# Patient Record
Sex: Female | Born: 1960
Health system: Southern US, Community
[De-identification: ages and names within clinical notes are randomized; demographics above are authoritative.]

## PROBLEM LIST (undated history)

## (undated) DIAGNOSIS — K219 Gastro-esophageal reflux disease without esophagitis: Secondary | ICD-10-CM

## (undated) DIAGNOSIS — E785 Hyperlipidemia, unspecified: Secondary | ICD-10-CM

## (undated) DIAGNOSIS — M1990 Unspecified osteoarthritis, unspecified site: Secondary | ICD-10-CM

## (undated) DIAGNOSIS — I10 Essential (primary) hypertension: Secondary | ICD-10-CM

## (undated) DIAGNOSIS — T7840XA Allergy, unspecified, initial encounter: Secondary | ICD-10-CM

## (undated) DIAGNOSIS — F419 Anxiety disorder, unspecified: Secondary | ICD-10-CM

## (undated) DIAGNOSIS — E079 Disorder of thyroid, unspecified: Secondary | ICD-10-CM

## (undated) HISTORY — DX: Disorder of thyroid, unspecified: E07.9

## (undated) HISTORY — DX: Hyperlipidemia, unspecified: E78.5

## (undated) HISTORY — DX: Unspecified osteoarthritis, unspecified site: M19.90

## (undated) HISTORY — DX: Gastro-esophageal reflux disease without esophagitis: K21.9

## (undated) HISTORY — DX: Allergy, unspecified, initial encounter: T78.40XA

## (undated) HISTORY — DX: Anxiety disorder, unspecified: F41.9

## (undated) HISTORY — DX: Essential (primary) hypertension: I10

---
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
## 2006-02-09 HISTORY — PX: SHOULDER SURGERY: SHX246

## 2007-02-10 HISTORY — PX: KNEE SURGERY: SHX244

## 2010-02-09 HISTORY — PX: SHOULDER SURGERY: SHX246

## 2016-04-08 DIAGNOSIS — E039 Hypothyroidism, unspecified: Secondary | ICD-10-CM | POA: Insufficient documentation

## 2016-04-08 DIAGNOSIS — I10 Essential (primary) hypertension: Secondary | ICD-10-CM | POA: Insufficient documentation

## 2016-04-08 DIAGNOSIS — F419 Anxiety disorder, unspecified: Secondary | ICD-10-CM | POA: Insufficient documentation

## 2017-08-10 DIAGNOSIS — K2990 Gastroduodenitis, unspecified, without bleeding: Secondary | ICD-10-CM | POA: Insufficient documentation

## 2017-11-26 DIAGNOSIS — K259 Gastric ulcer, unspecified as acute or chronic, without hemorrhage or perforation: Secondary | ICD-10-CM | POA: Insufficient documentation

## 2017-11-26 DIAGNOSIS — K2210 Ulcer of esophagus without bleeding: Secondary | ICD-10-CM | POA: Insufficient documentation

## 2017-11-26 DIAGNOSIS — K2100 Gastro-esophageal reflux disease with esophagitis, without bleeding: Secondary | ICD-10-CM | POA: Insufficient documentation

## 2019-01-20 DIAGNOSIS — K635 Polyp of colon: Secondary | ICD-10-CM | POA: Insufficient documentation

## 2019-02-10 HISTORY — PX: OTHER SURGICAL HISTORY: SHX169

## 2019-07-24 DIAGNOSIS — R9431 Abnormal electrocardiogram [ECG] [EKG]: Secondary | ICD-10-CM | POA: Insufficient documentation

## 2019-10-11 DIAGNOSIS — R197 Diarrhea, unspecified: Secondary | ICD-10-CM | POA: Diagnosis not present

## 2019-10-11 DIAGNOSIS — Z86010 Personal history of colonic polyps: Secondary | ICD-10-CM | POA: Diagnosis not present

## 2019-10-11 DIAGNOSIS — K219 Gastro-esophageal reflux disease without esophagitis: Secondary | ICD-10-CM | POA: Diagnosis not present

## 2019-10-11 DIAGNOSIS — R109 Unspecified abdominal pain: Secondary | ICD-10-CM | POA: Diagnosis not present

## 2019-10-18 DIAGNOSIS — R197 Diarrhea, unspecified: Secondary | ICD-10-CM | POA: Diagnosis not present

## 2019-10-18 DIAGNOSIS — R748 Abnormal levels of other serum enzymes: Secondary | ICD-10-CM | POA: Diagnosis not present

## 2019-10-18 DIAGNOSIS — R300 Dysuria: Secondary | ICD-10-CM | POA: Diagnosis not present

## 2019-10-18 DIAGNOSIS — E785 Hyperlipidemia, unspecified: Secondary | ICD-10-CM | POA: Diagnosis not present

## 2019-10-19 DIAGNOSIS — K828 Other specified diseases of gallbladder: Secondary | ICD-10-CM | POA: Diagnosis not present

## 2019-10-19 DIAGNOSIS — K7689 Other specified diseases of liver: Secondary | ICD-10-CM | POA: Diagnosis not present

## 2019-10-19 DIAGNOSIS — R748 Abnormal levels of other serum enzymes: Secondary | ICD-10-CM | POA: Diagnosis not present

## 2019-11-02 DIAGNOSIS — R197 Diarrhea, unspecified: Secondary | ICD-10-CM | POA: Diagnosis not present

## 2019-11-02 DIAGNOSIS — R109 Unspecified abdominal pain: Secondary | ICD-10-CM | POA: Diagnosis not present

## 2019-11-02 DIAGNOSIS — K5730 Diverticulosis of large intestine without perforation or abscess without bleeding: Secondary | ICD-10-CM | POA: Diagnosis not present

## 2019-12-12 DIAGNOSIS — R350 Frequency of micturition: Secondary | ICD-10-CM | POA: Diagnosis not present

## 2019-12-12 DIAGNOSIS — N3001 Acute cystitis with hematuria: Secondary | ICD-10-CM | POA: Diagnosis not present

## 2019-12-12 DIAGNOSIS — R300 Dysuria: Secondary | ICD-10-CM | POA: Diagnosis not present

## 2020-02-13 DIAGNOSIS — R197 Diarrhea, unspecified: Secondary | ICD-10-CM | POA: Diagnosis not present

## 2020-02-13 DIAGNOSIS — K219 Gastro-esophageal reflux disease without esophagitis: Secondary | ICD-10-CM | POA: Diagnosis not present

## 2020-03-13 ENCOUNTER — Ambulatory Visit (INDEPENDENT_AMBULATORY_CARE_PROVIDER_SITE_OTHER): Payer: BC Managed Care – PPO | Admitting: Family Medicine

## 2020-03-13 ENCOUNTER — Ambulatory Visit (INDEPENDENT_AMBULATORY_CARE_PROVIDER_SITE_OTHER): Payer: BC Managed Care – PPO

## 2020-03-13 ENCOUNTER — Encounter: Payer: Self-pay | Admitting: Family Medicine

## 2020-03-13 ENCOUNTER — Other Ambulatory Visit: Payer: Self-pay

## 2020-03-13 VITALS — BP 117/78 | HR 64 | Temp 97.6°F | Ht 63.0 in | Wt 140.5 lb

## 2020-03-13 DIAGNOSIS — M19041 Primary osteoarthritis, right hand: Secondary | ICD-10-CM | POA: Diagnosis not present

## 2020-03-13 DIAGNOSIS — Z7689 Persons encountering health services in other specified circumstances: Secondary | ICD-10-CM

## 2020-03-13 DIAGNOSIS — F419 Anxiety disorder, unspecified: Secondary | ICD-10-CM

## 2020-03-13 DIAGNOSIS — M79644 Pain in right finger(s): Secondary | ICD-10-CM

## 2020-03-13 DIAGNOSIS — Z1231 Encounter for screening mammogram for malignant neoplasm of breast: Secondary | ICD-10-CM | POA: Diagnosis not present

## 2020-03-13 DIAGNOSIS — I10 Essential (primary) hypertension: Secondary | ICD-10-CM | POA: Diagnosis not present

## 2020-03-13 MED ORDER — OLMESARTAN MEDOXOMIL 40 MG PO TABS: 40.0000 mg | ORAL_TABLET | Freq: Every day | ORAL | 2 refills | Status: DC

## 2020-03-13 MED ORDER — DESVENLAFAXINE SUCCINATE ER 50 MG PO TB24: 50.0000 mg | ORAL_TABLET | Freq: Every day | ORAL | 2 refills | Status: DC

## 2020-03-13 NOTE — Progress Notes (Signed)
New Patient Office Visit  Subjective:  Patient ID: Cynthia Bolton, female    DOB: 07-Mar-1960  Age: 60 y.o. MRN: [id number]  CC:  Chief Complaint  Patient presents with  . New Patient (Initial Visit)    HPI Cynthia Bolton presents to establish care. She reports her HTN and hypothyroidism are both well controlled. She had labs about a month ago. She reports her anxiety is controlled well with pristiq.  She has one concern today.  1. Nodule of right pinky Cynthia Bolton has a nodule on her right pinky that looks like a blister of sorts. It is on the DIP.  It has been there for 6 months. Her previous PCP did lab work including a rheumatology panel which was normal. The plan to was to complete an xray next, but she moved prior to it being done. She does have a history of arthritis in her fingers. She has pain in the DIP of her pinky and the nodule will sometime becomes a little red. She denies injury, drainage, decreased ROM.  Past Medical History:  Diagnosis Date  . Allergy   . Anxiety   . Arthritis   . GERD (gastroesophageal reflux disease)   . Hyperlipidemia   . Hypertension   . Thyroid disease     Past Surgical History:  Procedure Laterality Date  . KNEE SURGERY Left 2009   left knee scope  . linx device  2021   linx device inserted for GERD  . SHOULDER SURGERY Right 2008   right shoulder scope  . SHOULDER SURGERY Left 2012   left shoulder scope  . TONSILECTOMY, ADENOIDECTOMY, BILATERAL MYRINGOTOMY AND TUBES  1968    Family History  Problem Relation Age of Onset  . Arthritis Mother   . Diabetes Mother   . Heart disease Mother   . Hyperlipidemia Mother   . Hypertension Mother   . Heart disease Father   . Hypertension Father   . Anxiety disorder Sister   . Arthritis Sister   . Hyperlipidemia Sister   . Hypertension Sister   . Hyperlipidemia Brother   . Hypertension Brother   . Heart disease Daughter 76       had pacemaker placed for unknown reason  . Stroke Maternal  Grandmother   . Heart disease Maternal Grandfather   . Heart disease Paternal Grandfather   . Heart attack Paternal Grandfather     Social History   Socioeconomic History  . Marital status: Divorced    Spouse name: Not on file  . Number of children: 2  . Years of education: 83  . Highest education level: High school graduate  Occupational History  . Occupation: Engineer, petroleum  Tobacco Use  . Smoking status: Former Smoker    Packs/day: 1.00    Years: 3.00    Pack years: 3.00    Types: Cigarettes    Quit date: 02/2015    Years since quitting: 5.0  . Smokeless tobacco: Never Used  Vaping Use  . Vaping Use: Never used  Substance and Sexual Activity  . Alcohol use: Yes    Alcohol/week: 36.0 standard drinks    Types: 21 Shots of liquor, 14 Glasses of wine, 1 Cans of beer per week  . Drug use: Yes    Types: Marijuana    Comment: to sleep at night  . Sexual activity: Not Currently    Birth control/protection: Post-menopausal  Other Topics Concern  . Not on file  Social History Narrative  . Not on  file   Social Determinants of Health   Financial Resource Strain: Not on file  Food Insecurity: Not on file  Transportation Needs: Not on file  Physical Activity: Not on file  Stress: Not on file  Social Connections: Not on file  Intimate Partner Violence: Not on file    ROS Review of Systems Negative unless specially indicated above in HPI.   Objective:   Today's Vitals: BP 117/78   Pulse 64   Temp 97.6 F (36.4 C) (Temporal)   Ht 5\' 3"  (1.6 m)   Wt 140 lb 8 oz (63.7 kg)   BMI 24.89 kg/m   Physical Exam Vitals and nursing note reviewed.  Constitutional:      General: She is not in acute distress.    Appearance: Normal appearance. She is not ill-appearing.  Cardiovascular:     Rate and Rhythm: Normal rate and regular rhythm.     Pulses: Normal pulses.     Heart sounds: Normal heart sounds. No murmur heard.   Pulmonary:     Effort: Pulmonary effort is  normal. No respiratory distress.     Breath sounds: Normal breath sounds.  Abdominal:     General: Bowel sounds are normal. There is no distension.     Palpations: Abdomen is soft. There is no mass.     Tenderness: There is no abdominal tenderness. There is no guarding or rebound.  Musculoskeletal:     Cervical back: Neck supple. No tenderness.     Right lower leg: No edema.     Left lower leg: No edema.     Comments: Heberden's nodules on bilateral 2-5th DIP. Full ROM, no warmth or TTP. Sensation intact.   Lymphadenopathy:     Cervical: No cervical adenopathy.  Skin:    General: Skin is warm and dry.  Neurological:     General: No focal deficit present.     Mental Status: She is alert and oriented to person, place, and time.  Psychiatric:        Mood and Affect: Mood normal.        Behavior: Behavior normal.     Assessment & Plan:   Cynthia Bolton was seen today for new patient (initial visit).  Diagnoses and all orders for this visit:  Encounter for screening mammogram for malignant neoplasm of breast Screening mammogram ordered.  -     MM Digital Screening; Future  Pain of finger of right hand Xray today in office. Radiology reports pending.  -     DG Finger Little Right  Anxiety Well controlled on current regimen.  -     olmesartan (BENICAR) 40 MG tablet; Take 1 tablet (40 mg total) by mouth daily.  Primary hypertension Well controlled on current regimen.  -     desvenlafaxine (PRISTIQ) 50 MG 24 hr tablet; Take 1 tablet (50 mg total) by mouth daily.  Encounter to establish care Reviewed records available in Epic.   Follow-up: Return in about 2 months (around 05/11/2020) for CPE with PAP.   The patient indicates understanding of these issues and agrees with the plan.    07/11/2020, FNP

## 2020-03-13 NOTE — Patient Instructions (Signed)

## 2020-05-07 ENCOUNTER — Ambulatory Visit
Admission: RE | Admit: 2020-05-07 | Discharge: 2020-05-07 | Disposition: A | Discharge status: Home / Self Care | Payer: BC Managed Care – PPO | Source: Ambulatory Visit | Attending: Family Medicine | Admitting: Family Medicine

## 2020-05-07 ENCOUNTER — Other Ambulatory Visit: Payer: Self-pay

## 2020-05-07 DIAGNOSIS — Z1231 Encounter for screening mammogram for malignant neoplasm of breast: Secondary | ICD-10-CM

## 2020-05-10 ENCOUNTER — Ambulatory Visit (INDEPENDENT_AMBULATORY_CARE_PROVIDER_SITE_OTHER): Payer: BC Managed Care – PPO | Admitting: Family Medicine

## 2020-05-10 ENCOUNTER — Other Ambulatory Visit (HOSPITAL_COMMUNITY)
Admission: RE | Admit: 2020-05-10 | Discharge: 2020-05-10 | Disposition: A | Discharge status: Home / Self Care | Payer: BC Managed Care – PPO | Source: Ambulatory Visit | Attending: Family Medicine | Admitting: Family Medicine

## 2020-05-10 ENCOUNTER — Encounter: Payer: Self-pay | Admitting: Family Medicine

## 2020-05-10 ENCOUNTER — Other Ambulatory Visit: Payer: Self-pay

## 2020-05-10 VITALS — BP 105/71 | HR 69 | Temp 96.8°F | Ht 63.0 in | Wt 138.6 lb

## 2020-05-10 DIAGNOSIS — N393 Stress incontinence (female) (male): Secondary | ICD-10-CM

## 2020-05-10 DIAGNOSIS — Z124 Encounter for screening for malignant neoplasm of cervix: Secondary | ICD-10-CM | POA: Insufficient documentation

## 2020-05-10 DIAGNOSIS — Z6826 Body mass index (BMI) 26.0-26.9, adult: Secondary | ICD-10-CM

## 2020-05-10 DIAGNOSIS — E039 Hypothyroidism, unspecified: Secondary | ICD-10-CM

## 2020-05-10 DIAGNOSIS — Z01419 Encounter for gynecological examination (general) (routine) without abnormal findings: Secondary | ICD-10-CM | POA: Diagnosis not present

## 2020-05-10 DIAGNOSIS — T162XXA Foreign body in left ear, initial encounter: Secondary | ICD-10-CM

## 2020-05-10 DIAGNOSIS — I10 Essential (primary) hypertension: Secondary | ICD-10-CM

## 2020-05-10 DIAGNOSIS — Z01411 Encounter for gynecological examination (general) (routine) with abnormal findings: Secondary | ICD-10-CM | POA: Diagnosis not present

## 2020-05-10 NOTE — Patient Instructions (Addendum)
Urinary Incontinence  Urinary incontinence refers to a condition in which a person is unable to control where and when to pass urine. A person with this condition will urinate when he or she does not mean to (involuntarily). What are the causes? This condition may be caused by:  Medicines.  Infections.  Constipation.  Overactive bladder muscles.  Weak bladder muscles.  Weak pelvic floor muscles. These muscles provide support for the bladder, intestine, and, in women, the uterus.  Enlarged prostate in men. The prostate is a gland near the bladder. When it gets too big, it can pinch the urethra. With the urethra blocked, the bladder can weaken and lose the ability to empty properly.  Surgery.  Emotional factors, such as anxiety, stress, or post-traumatic stress disorder (PTSD).  Pelvic organ prolapse. This happens in women when organs shift out of place and into the vagina. This shift can prevent the bladder and urethra from working properly. What increases the risk? The following factors may make you more likely to develop this condition:  Older age.  Obesity and physical inactivity.  Pregnancy and childbirth.  Menopause.  Diseases that affect the nerves or spinal cord (neurological diseases).  Long-term (chronic) coughing. This can increase pressure on the bladder and pelvic floor muscles. What are the signs or symptoms? Symptoms may vary depending on the type of urinary incontinence you have. They include:  A sudden urge to urinate, but passing urine involuntarily before you can get to a bathroom (urge incontinence).  Suddenly passing urine with any activity that forces urine to pass, such as coughing, laughing, exercise, or sneezing (stress incontinence).  Needing to urinate often, but urinating only a small amount, or constantly dribbling urine (overflow incontinence).  Urinating because you cannot get to the bathroom in time due to a physical disability, such as  arthritis or injury, or communication and thinking problems, such as Alzheimer disease (functional incontinence). How is this diagnosed? This condition may be diagnosed based on:  Your medical history.  A physical exam.  Tests, such as: ? Urine tests. ? X-rays of your kidney and bladder. ? Ultrasound. ? CT scan. ? Cystoscopy. In this procedure, a health care provider inserts a tube with a light and camera (cystoscope) through the urethra and into the bladder in order to check for problems. ? Urodynamic testing. These tests assess how well the bladder, urethra, and sphincter can store and release urine. There are different types of urodynamic tests, and they vary depending on what the test is measuring. To help diagnose your condition, your health care provider may recommend that you keep a log of when you urinate and how much you urinate. How is this treated? Treatment for this condition depends on the type of incontinence that you have and its cause. Treatment may include:  Lifestyle changes, such as: ? Quitting smoking. ? Maintaining a healthy weight. ? Staying active. Try to get 150 minutes of moderate-intensity exercise every week. Ask your health care provider which activities are safe for you. ? Eating a healthy diet.  Avoid high-fat foods, like fried foods.  Avoid refined carbohydrates like white bread and white rice.  Limit how much alcohol and caffeine you drink.  Increase your fiber intake. Foods such as fresh fruits, vegetables, beans, and whole grains are healthy sources of fiber.  Pelvic floor muscle exercises.  Bladder training, such as lengthening the amount of time between bathroom breaks, or using the bathroom at regular intervals.  Using techniques to suppress bladder urges.   This can include distraction techniques or controlled breathing exercises.  Medicines to relax the bladder muscles and prevent bladder spasms.  Medicines to help slow or prevent the  growth of a man's prostate.  Botox injections. These can help relax the bladder muscles.  Using pulses of electricity to help change bladder reflexes (electrical nerve stimulation).  For women, using a medical device to prevent urine leaks. This is a small, tampon-like, disposable device that is inserted into the urethra.  Injecting collagen or carbon beads (bulking agents) into the urinary sphincter. These can help thicken tissue and close the bladder opening.  Surgery. Follow these instructions at home: Lifestyle  Limit alcohol and caffeine. These can fill your bladder quickly and irritate it.  Keep yourself clean to help prevent odors and skin damage. Ask your doctor about special skin creams and cleansers that can protect the skin from urine.  Consider wearing pads or adult diapers. Make sure to change them regularly, and always change them right after experiencing incontinence. General instructions  Take over-the-counter and prescription medicines only as told by your health care provider.  Use the bathroom about every 3-4 hours, even if you do not feel the need to urinate. Try to empty your bladder completely every time. After urinating, wait a minute. Then try to urinate again.  Make sure you are in a relaxed position while urinating.  If your incontinence is caused by nerve problems, keep a log of the medicines you take and the times you go to the bathroom.  Keep all follow-up visits as told by your health care provider. This is important. Contact a health care provider if:  You have pain that gets worse.  Your incontinence gets worse. Get help right away if:  You have a fever or chills.  You are unable to urinate.  You have redness in your groin area or down your legs. Summary  Urinary incontinence refers to a condition in which a person is unable to control where and when to pass urine.  This condition may be caused by medicines, infection, weak bladder  muscles, weak pelvic floor muscles, enlargement of the prostate (in men), or surgery.  The following factors increase your risk for developing this condition: older age, obesity, pregnancy and childbirth, menopause, neurological diseases, and chronic coughing.  There are several types of urinary incontinence. They include urge incontinence, stress incontinence, overflow incontinence, and functional incontinence.  This condition is usually treated first with lifestyle and behavioral changes, such as quitting smoking, eating a healthier diet, and doing regular pelvic floor exercises. Other treatment options include medicines, bulking agents, medical devices, electrical nerve stimulation, or surgery. This information is not intended to replace advice given to you by your health care provider. Make sure you discuss any questions you have with your health care provider. Document Revised: 02/05/2017 Document Reviewed: 05/07/2016 Elsevier Patient Education  2021 [employer]   Health Maintenance, Female Adopting a healthy lifestyle and getting preventive care are important in promoting health and wellness. Ask your health care provider about:  The right schedule for you to have regular tests and exams.  Things you can do on your own to prevent diseases and keep yourself healthy. What should I know about diet, weight, and exercise? Eat a healthy diet  Eat a diet that includes plenty of vegetables, fruits, low-fat dairy products, and lean protein.  Do not eat a lot of foods that are high in solid fats, added sugars, or sodium.   Maintain a healthy  weight Body mass index (BMI) is used to identify weight problems. It estimates body fat based on height and weight. Your health care provider can help determine your BMI and help you achieve or maintain a healthy weight. Get regular exercise Get regular exercise. This is one of the most important things you can do for your health. Most adults  should:  Exercise for at least 150 minutes each week. The exercise should increase your heart rate and make you sweat (moderate-intensity exercise).  Do strengthening exercises at least twice a week. This is in addition to the moderate-intensity exercise.  Spend less time sitting. Even light physical activity can be beneficial. Watch cholesterol and blood lipids Have your blood tested for lipids and cholesterol at 60 years of age, then have this test every 5 years. Have your cholesterol levels checked more often if:  Your lipid or cholesterol levels are high.  You are older than 60 years of age.  You are at high risk for heart disease. What should I know about cancer screening? Depending on your health history and family history, you may need to have cancer screening at various ages. This may include screening for:  Breast cancer.  Cervical cancer.  Colorectal cancer.  Skin cancer.  Lung cancer. What should I know about heart disease, diabetes, and high blood pressure? Blood pressure and heart disease  High blood pressure causes heart disease and increases the risk of stroke. This is more likely to develop in people who have high blood pressure readings, are of African descent, or are overweight.  Have your blood pressure checked: ? Every 3-5 years if you are 55-84 years of age. ? Every year if you are 34 years old or older. Diabetes Have regular diabetes screenings. This checks your fasting blood sugar level. Have the screening done:  Once every three years after age 37 if you are at a normal weight and have a low risk for diabetes.  More often and at a younger age if you are overweight or have a high risk for diabetes. What should I know about preventing infection? Hepatitis B If you have a higher risk for hepatitis B, you should be screened for this virus. Talk with your health care provider to find out if you are at risk for hepatitis B infection. Hepatitis C Testing  is recommended for:  Everyone born from [birth year] through 1965.  Anyone with known risk factors for hepatitis C. Sexually transmitted infections (STIs)  Get screened for STIs, including gonorrhea and chlamydia, if: ? You are sexually active and are younger than 60 years of age. ? You are older than 60 years of age and your health care provider tells you that you are at risk for this type of infection. ? Your sexual activity has changed since you were last screened, and you are at increased risk for chlamydia or gonorrhea. Ask your health care provider if you are at risk.  Ask your health care provider about whether you are at high risk for HIV. Your health care provider may recommend a prescription medicine to help prevent HIV infection. If you choose to take medicine to prevent HIV, you should first get tested for HIV. You should then be tested every 3 months for as long as you are taking the medicine. Pregnancy  If you are about to stop having your period (premenopausal) and you may become pregnant, seek counseling before you get pregnant.  Take 400 to 800 micrograms (mcg) of folic acid every day  if you become pregnant.  Ask for birth control (contraception) if you want to prevent pregnancy. Osteoporosis and menopause Osteoporosis is a disease in which the bones lose minerals and strength with aging. This can result in bone fractures. If you are 60 years old or older, or if you are at risk for osteoporosis and fractures, ask your health care provider if you should:  Be screened for bone loss.  Take a calcium or vitamin D supplement to lower your risk of fractures.  Be given hormone replacement therapy (HRT) to treat symptoms of menopause. Follow these instructions at home: Lifestyle  Do not use any products that contain nicotine or tobacco, such as cigarettes, e-cigarettes, and chewing tobacco. If you need help quitting, ask your health care provider.  Do not use street drugs.  Do not  share needles.  Ask your health care provider for help if you need support or information about quitting drugs. Alcohol use  Do not drink alcohol if: ? Your health care provider tells you not to drink. ? You are pregnant, may be pregnant, or are planning to become pregnant.  If you drink alcohol: ? Limit how much you use to 0-1 drink a day. ? Limit intake if you are breastfeeding.  Be aware of how much alcohol is in your drink. In the [place] one drink equals one 12 oz bottle of beer (355 mL), one 5 oz glass of wine (148 mL), or one 1 oz glass of hard liquor (44 mL). General instructions  Schedule regular health, dental, and eye exams.  Stay current with your vaccines.  Tell your health care provider if: ? You often feel depressed. ? You have ever been abused or do not feel safe at home. Summary  Adopting a healthy lifestyle and getting preventive care are important in promoting health and wellness.  Follow your health care provider's instructions about healthy diet, exercising, and getting tested or screened for diseases.  Follow your health care provider's instructions on monitoring your cholesterol and blood pressure. This information is not intended to replace advice given to you by your health care provider. Make sure you discuss any questions you have with your health care provider. Document Revised: 01/19/2018 Document Reviewed: 01/19/2018 Elsevier Patient Education  2021 [employer] Inc.     Why follow it? Research shows. . Those who follow the Mediterranean diet have a reduced risk of heart disease  . The diet is associated with a reduced incidence of Parkinson's and Alzheimer's diseases . People following the diet may have longer life expectancies and lower rates of chronic diseases  . The Dietary Guidelines for Americans recommends the Mediterranean diet as an eating plan to promote health and prevent disease  What Is the Mediterranean Diet?  . Healthy eating plan  based on typical foods and recipes of Mediterranean-style cooking . The diet is primarily a plant based diet; these foods should make up a majority of meals   Starches - Plant based foods should make up a majority of meals - They are an important sources of vitamins, minerals, energy, antioxidants, and fiber - Choose whole grains, foods high in fiber and minimally processed items  - Typical grain sources include wheat, oats, barley, corn, brown rice, bulgar, farro, millet, polenta, couscous  - Various types of beans include chickpeas, lentils, fava beans, black beans, white beans   Fruits  Veggies - Large quantities of antioxidant rich fruits & veggies; 6 or more servings  - Vegetables can be eaten raw or  lightly drizzled with oil and cooked  - Vegetables common to the traditional Mediterranean Diet include: artichokes, arugula, beets, broccoli, brussel sprouts, cabbage, carrots, celery, collard greens, cucumbers, eggplant, kale, leeks, lemons, lettuce, mushrooms, okra, onions, peas, peppers, potatoes, pumpkin, radishes, rutabaga, shallots, spinach, sweet potatoes, turnips, zucchini - Fruits common to the Mediterranean Diet include: apples, apricots, avocados, cherries, clementines, dates, figs, grapefruits, grapes, melons, nectarines, oranges, peaches, pears, pomegranates, strawberries, tangerines  Fats - Replace butter and margarine with healthy oils, such as olive oil, canola oil, and tahini  - Limit nuts to no more than a handful a day  - Nuts include walnuts, almonds, pecans, pistachios, pine nuts  - Limit or avoid candied, honey roasted or heavily salted nuts - Olives are central to the [employer] - can be eaten whole or used in a variety of dishes   Meats Protein - Limiting red meat: no more than a few times a month - When eating red meat: choose lean cuts and keep the portion to the size of deck of cards - Eggs: approx. 0 to 4 times a week  - Fish and lean poultry: at least 2 a  week  - Healthy protein sources include, chicken, [place], lean beef, lamb - Increase intake of seafood such as tuna, salmon, trout, mackerel, shrimp, scallops - Avoid or limit high fat processed meats such as sausage and bacon  Dairy - Include moderate amounts of low fat dairy products  - Focus on healthy dairy such as fat free yogurt, skim milk, low or reduced fat cheese - Limit dairy products higher in fat such as whole or 2% milk, cheese, ice cream  Alcohol - Moderate amounts of red wine is ok  - No more than 5 oz daily for women (all ages) and men older than age 63  - No more than 10 oz of wine daily for men younger than 65  Other - Limit sweets and other desserts  - Use herbs and spices instead of salt to flavor foods  - Herbs and spices common to the traditional Mediterranean Diet include: basil, bay leaves, chives, cloves, cumin, fennel, garlic, lavender, marjoram, mint, oregano, parsley, pepper, rosemary, sage, savory, sumac, tarragon, thyme   It's not just a diet, it's a lifestyle:  . The Mediterranean diet includes lifestyle factors typical of those in the region  . Foods, drinks and meals are best eaten with others and savored . Daily physical activity is important for overall good health . This could be strenuous exercise like running and aerobics . This could also be more leisurely activities such as walking, housework, yard-work, or taking the stairs . Moderation is the key; a balanced and healthy diet accommodates most foods and drinks . Consider portion sizes and frequency of consumption of certain foods   Meal Ideas & Options:  . Breakfast:  o Whole wheat toast or whole wheat English muffins with peanut butter & hard boiled egg o Steel cut oats topped with apples & cinnamon and skim milk  o Fresh fruit: banana, strawberries, melon, berries, peaches  o Smoothies: strawberries, bananas, greek yogurt, peanut butter o Low fat greek yogurt with blueberries and granola  o Egg  white omelet with spinach and mushrooms o Breakfast couscous: whole wheat couscous, apricots, skim milk, cranberries  . Sandwiches:  o Hummus and grilled vegetables (peppers, zucchini, squash) on whole wheat bread   o Grilled chicken on whole wheat pita with lettuce, tomatoes, cucumbers or tzatziki  o Tuna salad on whole  wheat bread: tuna salad made with greek yogurt, olives, red peppers, capers, green onions o Garlic rosemary lamb pita: lamb sauted with garlic, rosemary, salt & pepper; add lettuce, cucumber, greek yogurt to pita - flavor with lemon juice and black pepper  . Seafood:  o Mediterranean grilled salmon, seasoned with garlic, basil, parsley, lemon juice and black pepper o Shrimp, lemon, and spinach whole-grain pasta salad made with low fat greek yogurt  o Seared scallops with lemon orzo  o Seared tuna steaks seasoned salt, pepper, coriander topped with tomato mixture of olives, tomatoes, olive oil, minced garlic, parsley, green onions and cappers  . Meats:  o Herbed greek chicken salad with kalamata olives, cucumber, feta  o Red bell peppers stuffed with spinach, bulgur, lean ground beef (or lentils) & topped with feta   o Kebabs: skewers of chicken, tomatoes, onions, zucchini, squash  o [place] burgers: made with red onions, mint, dill, lemon juice, feta cheese topped with roasted red peppers . Vegetarian o Cucumber salad: cucumbers, artichoke hearts, celery, red onion, feta cheese, tossed in olive oil & lemon juice  o Hummus and whole grain pita points with a greek salad (lettuce, tomato, feta, olives, cucumbers, red onion) o Lentil soup with celery, carrots made with vegetable broth, garlic, salt and pepper  o Tabouli salad: parsley, bulgur, mint, scallions, cucumbers, tomato, radishes, lemon juice, olive oil, salt and pepper.

## 2020-05-10 NOTE — Progress Notes (Signed)
Cynthia Bolton is a 60 y.o. female presents to office today for annual physical exam examination.    Concerns today include: 1. Leaking urine [name] is concerned about leaking urine. She reports leaking with sneezing and coughing for some time however recently she has noticed intermittent wetness at times and a odor of urine. Denies fever, urgency, frequency, or dysuria.   Occupation: Librarian, academic for medical records, Marital status: single, Substance use:  Last colonoscopy: 2021 Last mammogram: 3 days ago, report pending Last pap smear: 3 years ago   Past Medical History:  Diagnosis Date  . Allergy   . Anxiety   . Arthritis   . GERD (gastroesophageal reflux disease)   . Hyperlipidemia   . Hypertension   . Thyroid disease    Social History   Socioeconomic History  . Marital status: Divorced    Spouse name: Not on file  . Number of children: 2  . Years of education: 44  . Highest education level: High school graduate  Occupational History  . Occupation: Sports administrator  Tobacco Use  . Smoking status: Former Smoker    Packs/day: 1.00    Years: 3.00    Pack years: 3.00    Types: Cigarettes    Quit date: 02/2015    Years since quitting: 5.2  . Smokeless tobacco: Never Used  Vaping Use  . Vaping Use: Never used  Substance and Sexual Activity  . Alcohol use: Yes    Alcohol/week: 36.0 standard drinks    Types: 21 Shots of liquor, 14 Glasses of wine, 1 Cans of beer per week  . Drug use: Yes    Types: Marijuana    Comment: to sleep at night  . Sexual activity: Not Currently    Birth control/protection: Post-menopausal  Other Topics Concern  . Not on file  Social History Narrative  . Not on file   Social Determinants of Health   Financial Resource Strain: Not on file  Food Insecurity: Not on file  Transportation Needs: Not on file  Physical Activity: Not on file  Stress: Not on file  Social Connections: Not on file  Intimate Partner Violence: Not on file   Past  Surgical History:  Procedure Laterality Date  . KNEE SURGERY Left 2009   left knee scope  . linx device  2021   linx device inserted for GERD  . SHOULDER SURGERY Right 2008   right shoulder scope  . SHOULDER SURGERY Left 2012   left shoulder scope  . TONSILECTOMY, ADENOIDECTOMY, BILATERAL MYRINGOTOMY AND TUBES  1968   Family History  Problem Relation Age of Onset  . Arthritis Mother   . Diabetes Mother   . Heart disease Mother   . Hyperlipidemia Mother   . Hypertension Mother   . Heart disease Father   . Hypertension Father   . Anxiety disorder Sister   . Arthritis Sister   . Hyperlipidemia Sister   . Hypertension Sister   . Hyperlipidemia Brother   . Hypertension Brother   . Heart disease Daughter 73       had pacemaker placed for unknown reason  . Stroke Maternal Grandmother   . Heart disease Maternal Grandfather   . Heart disease Paternal Grandfather   . Heart attack Paternal Grandfather     Current Outpatient Medications:  .  Cholecalciferol 25 MCG (1000 UT) tablet, Take 1,000 Units by mouth daily., Disp: , Rfl:  .  desvenlafaxine (PRISTIQ) 50 MG 24 hr tablet, Take 1 tablet (50  mg total) by mouth daily., Disp: 90 tablet, Rfl: 2 .  ibuprofen (ADVIL) 600 MG tablet, Take 600 mg by mouth as needed., Disp: , Rfl:  .  levothyroxine (SYNTHROID) 50 MCG tablet, Take 50 mcg by mouth daily., Disp: , Rfl:  .  olmesartan (BENICAR) 40 MG tablet, Take 1 tablet (40 mg total) by mouth daily., Disp: 90 tablet, Rfl: 2 .  valACYclovir (VALTREX) 500 MG tablet, Take 500 mg by mouth as needed., Disp: , Rfl:   Allergies  Allergen Reactions  . Sulfamethoxazole-Trimethoprim Diarrhea     ROS: Review of Systems Pertinent items noted in HPI and remainder of comprehensive ROS otherwise negative.    Physical exam BP 105/71   Pulse 69   Temp (!) 96.8 F (36 C) (Temporal)   Ht _0  (1.6 m)   Wt 148 lb 9.6 oz (67.4 kg)   SpO2 100%   BMI 26.32 kg/m  General appearance: alert,  cooperative and no distress Head: Normocephalic, without obvious abnormality, atraumatic Eyes: conjunctivae/corneas clear. PERRL, EOM's intact.  Ears: normal TM's bilaterally. Normal right external ear and canal. Left ear: normal external ear. Black string like object present in canal.  Nose: Nares normal. Septum midline. Mucosa normal. No drainage or sinus tenderness. Throat: lips, mucosa, and tongue normal; teeth and gums normal Neck: no adenopathy, no carotid bruit, no JVD, supple, symmetrical, trachea midline and thyroid not enlarged, symmetric, no tenderness/mass/nodules Lungs: clear to auscultation bilaterally Heart: regular rate and rhythm, S1, S2 normal, no murmur, click, rub or gallop Abdomen: soft, non-tender; bowel sounds normal; no masses,  no organomegaly Pelvic: cervix normal in appearance, external genitalia normal, no adnexal masses or tenderness, no cervical motion tenderness, uterus normal size, shape, and consistency and vagina normal without discharge Extremities: extremities normal, atraumatic, no cyanosis or edema Skin: Skin color, texture, turgor normal. No rashes or lesions Neurologic: Alert and oriented X 3, normal strength and tone. Normal symmetric reflexes. Normal coordination and gait    Assessment/ Plan: Cynthia Bolton here for annual physical exam.   Cynthia Bolton was seen today for annual exam.  Diagnoses and all orders for this visit:  Well female exam with routine gynecological exam Labs pending as below.  -     Cytology - PAP -     CBC with Differential/Platelet -     CMP14+EGFR -     Lipid panel -     TSH -     VITAMIN D 25 Hydroxy (Vit-D Deficiency, Fractures)  BMI 26.0-26.9,adult Diet and exercise. Labs pending.  -     CBC with Differential/Platelet -     CMP14+EGFR -     Lipid panel -     TSH  Screening for malignant neoplasm of cervix -     Cytology - PAP  Stress incontinence GYN referral placed.  -     Ambulatory referral to  Gynecology  Foreign body of left ear, initial encounter Irrigation of left ear today in office- black dog hair removed.   Primary hypertension Well controlled on current regimen. Labs pending.  -     CBC with Differential/Platelet -     CMP14+EGFR -     Lipid panel  Acquired hypothyroidism On synthroid. Labs pending. -     TSH   Counseled on healthy lifestyle choices, including diet (rich in fruits, vegetables and lean meats and low in salt and simple carbohydrates) and exercise (at least 30 minutes of moderate physical activity daily).  Patient to follow up in  1 year for annual exam or sooner if needed.  The above assessment and management plan was discussed with the patient. The patient verbalized understanding of and has agreed to the management plan. Patient is aware to call the clinic if symptoms persist or worsen. Patient is aware when to return to the clinic for a follow-up visit. Patient educated on when it is appropriate to go to the emergency department.   Marjorie Smolder, FNP-C [facility] [address] [place], [place] [address] [phone]

## 2020-05-11 LAB — CBC WITH DIFFERENTIAL/PLATELET
Basophils Absolute: 0 10*3/uL (ref 0.0–0.2)
Basos: 1 %
EOS (ABSOLUTE): 0.1 10*3/uL (ref 0.0–0.4)
Eos: 1 %
Hematocrit: 37.1 % (ref 34.0–46.6)
Hemoglobin: 12.9 g/dL (ref 11.1–15.9)
Immature Grans (Abs): 0 10*3/uL (ref 0.0–0.1)
Immature Granulocytes: 0 %
Lymphocytes Absolute: 2.5 10*3/uL (ref 0.7–3.1)
Lymphs: 34 %
MCH: 31.3 pg (ref 26.6–33.0)
MCHC: 34.8 g/dL (ref 31.5–35.7)
MCV: 90 fL (ref 79–97)
Monocytes Absolute: 0.5 10*3/uL (ref 0.1–0.9)
Monocytes: 7 %
Neutrophils Absolute: 4.3 10*3/uL (ref 1.4–7.0)
Neutrophils: 57 %
Platelets: 297 10*3/uL (ref 150–450)
RBC: 4.12 x10E6/uL (ref 3.77–5.28)
RDW: 12.3 % (ref 11.7–15.4)
WBC: 7.5 10*3/uL (ref 3.4–10.8)

## 2020-05-11 LAB — CMP14+EGFR
ALT: 17 IU/L (ref 0–32)
AST: 22 IU/L (ref 0–40)
Albumin/Globulin Ratio: 2.2 (ref 1.2–2.2)
Albumin: 4.9 g/dL (ref 3.8–4.9)
Alkaline Phosphatase: 90 IU/L (ref 44–121)
BUN/Creatinine Ratio: 19 (ref 12–28)
BUN: 19 mg/dL (ref 8–27)
Bilirubin Total: 0.7 mg/dL (ref 0.0–1.2)
CO2: 21 mmol/L (ref 20–29)
Calcium: 10.4 mg/dL — ABNORMAL HIGH (ref 8.7–10.3)
Chloride: 103 mmol/L (ref 96–106)
Creatinine, Ser: 1.01 mg/dL — ABNORMAL HIGH (ref 0.57–1.00)
Globulin, Total: 2.2 g/dL (ref 1.5–4.5)
Glucose: 107 mg/dL — ABNORMAL HIGH (ref 65–99)
Potassium: 5.7 mmol/L — ABNORMAL HIGH (ref 3.5–5.2)
Sodium: 142 mmol/L (ref 134–144)
Total Protein: 7.1 g/dL (ref 6.0–8.5)
eGFR: 64 mL/min/{1.73_m2} (ref >59)

## 2020-05-11 LAB — LIPID PANEL
Chol/HDL Ratio: 4.5 ratio — ABNORMAL HIGH (ref 0.0–4.4)
Cholesterol, Total: 253 mg/dL — ABNORMAL HIGH (ref 100–199)
HDL: 56 mg/dL (ref >39)
LDL Chol Calc (NIH): 174 mg/dL — ABNORMAL HIGH (ref 0–99)
Triglycerides: 126 mg/dL (ref 0–149)
VLDL Cholesterol Cal: 23 mg/dL (ref 5–40)

## 2020-05-11 LAB — TSH: TSH: 1.81 u[IU]/mL (ref 0.450–4.500)

## 2020-05-11 LAB — VITAMIN D 25 HYDROXY (VIT D DEFICIENCY, FRACTURES): Vit D, 25-Hydroxy: 46.5 ng/mL (ref 30.0–100.0)

## 2020-05-13 ENCOUNTER — Other Ambulatory Visit: Payer: Self-pay | Admitting: Family Medicine

## 2020-05-13 DIAGNOSIS — E875 Hyperkalemia: Secondary | ICD-10-CM

## 2020-05-13 LAB — CYTOLOGY - PAP
Chlamydia: NEGATIVE
Comment: NEGATIVE
Diagnosis: NEGATIVE

## 2020-05-23 ENCOUNTER — Other Ambulatory Visit: Payer: Self-pay

## 2020-05-23 ENCOUNTER — Other Ambulatory Visit: Payer: BC Managed Care – PPO

## 2020-05-23 DIAGNOSIS — E875 Hyperkalemia: Secondary | ICD-10-CM | POA: Diagnosis not present

## 2020-05-24 LAB — BMP8+EGFR
BUN/Creatinine Ratio: 13 (ref 12–28)
BUN: 14 mg/dL (ref 8–27)
CO2: 22 mmol/L (ref 20–29)
Calcium: 10.2 mg/dL (ref 8.7–10.3)
Chloride: 105 mmol/L (ref 96–106)
Creatinine, Ser: 1.07 mg/dL — ABNORMAL HIGH (ref 0.57–1.00)
Glucose: 100 mg/dL — ABNORMAL HIGH (ref 65–99)
Potassium: 5.1 mmol/L (ref 3.5–5.2)
Sodium: 142 mmol/L (ref 134–144)
eGFR: 59 mL/min/{1.73_m2} — ABNORMAL LOW (ref >59)

## 2020-07-04 ENCOUNTER — Other Ambulatory Visit (HOSPITAL_COMMUNITY)
Admission: RE | Admit: 2020-07-04 | Discharge: 2020-07-04 | Disposition: A | Discharge status: Home / Self Care | Payer: BC Managed Care – PPO | Source: Ambulatory Visit | Attending: Obstetrics and Gynecology | Admitting: Obstetrics and Gynecology

## 2020-07-04 ENCOUNTER — Other Ambulatory Visit: Payer: Self-pay

## 2020-07-04 ENCOUNTER — Ambulatory Visit (INDEPENDENT_AMBULATORY_CARE_PROVIDER_SITE_OTHER): Payer: BC Managed Care – PPO | Admitting: Family Medicine

## 2020-07-04 ENCOUNTER — Encounter: Payer: BC Managed Care – PPO | Admitting: Obstetrics and Gynecology

## 2020-07-04 ENCOUNTER — Encounter: Payer: Self-pay | Admitting: Family Medicine

## 2020-07-04 VITALS — BP 150/111 | HR 68 | Wt 142.0 lb

## 2020-07-04 DIAGNOSIS — N393 Stress incontinence (female) (male): Secondary | ICD-10-CM

## 2020-07-04 DIAGNOSIS — N3945 Continuous leakage: Secondary | ICD-10-CM | POA: Diagnosis not present

## 2020-07-04 DIAGNOSIS — N898 Other specified noninflammatory disorders of vagina: Secondary | ICD-10-CM | POA: Insufficient documentation

## 2020-07-04 DIAGNOSIS — R3121 Asymptomatic microscopic hematuria: Secondary | ICD-10-CM | POA: Diagnosis not present

## 2020-07-04 LAB — POCT URINALYSIS DIP (DEVICE)
Bilirubin Urine: NEGATIVE
Glucose, UA: NEGATIVE mg/dL
Ketones, ur: NEGATIVE mg/dL
Leukocytes,Ua: NEGATIVE
Nitrite: NEGATIVE
Protein, ur: NEGATIVE mg/dL
Specific Gravity, Urine: 1.02 (ref 1.005–1.030)
Urobilinogen, UA: 0.2 mg/dL (ref 0.0–1.0)
pH: 5.5 (ref 5.0–8.0)

## 2020-07-04 NOTE — Progress Notes (Signed)
   New GYNECOLOGY PROBLEM  VISIT ENCOUNTER NOTE  Subjective:   Cynthia Bolton is a 60 y.o. .[id number]. female here for a a problem GYN visit.  Current complaints: leaking urine for 5 year-- worse with coughing, sneezing, running, squatting. Reports it has become more continuous and feel she smells urine all the time.  Two vaginal deliveries, largest was sub 7#. Denies fecal incontinence. Denies back pain, paraesthesias in legs.   Reports no smoking history   Denies abnormal vaginal bleeding, discharge, pelvic pain, problems with intercourse or other gynecologic concerns.    Gynecologic History No LMP recorded. Patient is postmenopausal. Contraception: post menopausal status  Health Maintenance Due  Topic Date Due  . TETANUS/TDAP  Never done     The following portions of the patient's history were reviewed and updated as appropriate: allergies, current medications, past family history, past medical history, past social history, past surgical history and problem list.  Review of Systems Pertinent items are noted in HPI.   Objective:  BP (!) 150/111   Pulse 68   Wt 142 lb (64.4 kg)   BMI 25.15 kg/m  Gen: well appearing, NAD HEENT: no scleral icterus CV: RR Lung: Normal WOB Ext: warm well perfused  PELVIC: Urethral opening is slightly more prominent urethral with erythematous area visualized-- possible polyp? Normal appearing external genitalia; normal appearing vaginal mucosa and cervix.  No abnormal discharge noted.    Normal uterine size, no other palpable masses, no uterine or adnexal tenderness.   Assessment and Plan:   1. Vaginal odor - Cervicovaginal ancillary only( [place])  2. Stress incontinence Reviewed with patient the normality of exam overall and my concerns for urethral polyp? - Ambulatory referral to Physical Therapy - Ambulatory referral to Urogynecology  3. Continuous leakage of urine - Ambulatory referral to Physical Therapy - Ambulatory referral  to Urogynecology  4. Asymptomatic microscopic hematuria Referral to uro gyn for possible urethral polyp in the setting of urinary incontinence.   Please refer to After Visit Summary for other counseling recommendations.   Return in about 3 months (around 10/04/2020) for with Urogyn.   Future Appointments  Date Time Provider Department Center  11/11/2020  8:30 AM Gabriel Earing, FNP [facility] None     Federico Flake, MD, MPH, ABFM Attending Physician Faculty Practice- Center [facility]

## 2020-07-05 LAB — CERVICOVAGINAL ANCILLARY ONLY
Bacterial Vaginitis (gardnerella): NEGATIVE
Candida Glabrata: NEGATIVE
Candida Vaginitis: NEGATIVE
Chlamydia: NEGATIVE
Comment: NEGATIVE
Comment: NEGATIVE
Comment: NEGATIVE
Comment: NEGATIVE
Comment: NEGATIVE
Comment: NORMAL
Neisseria Gonorrhea: NEGATIVE
Trichomonas: NEGATIVE

## 2020-08-08 ENCOUNTER — Other Ambulatory Visit: Payer: Self-pay

## 2020-08-08 ENCOUNTER — Encounter: Payer: Self-pay | Admitting: Physical Therapy

## 2020-08-08 ENCOUNTER — Ambulatory Visit: Payer: BC Managed Care – PPO | Attending: Family Medicine | Admitting: Physical Therapy

## 2020-08-08 DIAGNOSIS — R2689 Other abnormalities of gait and mobility: Secondary | ICD-10-CM

## 2020-08-08 DIAGNOSIS — M6281 Muscle weakness (generalized): Secondary | ICD-10-CM | POA: Insufficient documentation

## 2020-08-08 NOTE — Therapy (Signed)
[facility] [address] [place], [place], [address] Phone: [phone]   Fax:  [phone]  Physical Therapy Evaluation  Patient Details  Name: Zacari Radick MRN: [id number] Date of Birth: 1960-11-09 Referring Provider (PT): Federico Flake, MD   Encounter Date: 08/08/2020   PT End of Session - 08/08/20 1208     Visit Number 1    Number of Visits 30    Authorization Type BCBS    Authorization - Visit Number 1    Authorization - Number of Visits 30    PT Start Time 1015    PT Stop Time 1059    PT Time Calculation (min) 44 min    Activity Tolerance Patient tolerated treatment well;No increased pain    Behavior During Therapy [facility] for tasks assessed/performed             Past Medical History:  Diagnosis Date   Allergy    Anxiety    Arthritis    GERD (gastroesophageal reflux disease)    Hyperlipidemia    Hypertension    Thyroid disease     Past Surgical History:  Procedure Laterality Date   KNEE SURGERY Left 2009   left knee scope   linx device  2021   linx device inserted for GERD   SHOULDER SURGERY Right 2008   right shoulder scope   SHOULDER SURGERY Left 2012   left shoulder scope   TONSILECTOMY, ADENOIDECTOMY, BILATERAL MYRINGOTOMY AND TUBES  1968    There were no vitals filed for this visit.    Subjective Assessment - 08/08/20 1014     Subjective Pt reports a one year history of urinary leakage with lifting weights and chronic history of leakage with coughing, laughing, sneezing and throughout the night light leakage is experienced.    Currently in Pain? No/denies                [facility] PT Assessment - 08/08/20 0001       Assessment   Medical Diagnosis N39.3 (ICD-10-CM) - Stress incontinence  N39.45 (ICD-10-CM) - Continuous leakage of urine    Referring Provider (PT) Federico Flake, MD    Onset Date/Surgical Date --   at least one year   Next MD Visit End of  August with Urologest    Prior Therapy previous PT but not for pelvic floor      Precautions   Precautions None      Restrictions   Weight Bearing Restrictions No      Balance Screen   Has the patient fallen in the past 6 months No      Home Environment   Living Environment Private residence    Living Arrangements Parent    Available Help at Discharge --   primary caregiger of father   Type of Home House    Additional Comments no additional leakage with stairs      Prior Function   Level of Independence Independent      Cognition   Overall Cognitive Status Within Functional Limits for tasks assessed      Observation/Other Assessments   Observations poor breathing mechanics without cues for correction with chest breathing noted and decreased rib mobility      Sensation   Light Touch Appears Intact      Coordination   Gross Motor Movements are Fluid and Coordinated Yes      Posture/Postural Control   Posture/Postural Control Postural limitations    Postural Limitations  Rounded Shoulders;Posterior pelvic tilt      ROM / Strength   AROM / PROM / Strength AROM;Strength      AROM   Overall AROM Comments Bil IR and ER limited 25%, hamstring 25% limited,    AROM Assessment Site Lumbar;Thoracic    Lumbar Flexion WFL    Lumbar Extension WFL    Lumbar - Right Side Bend limited by 25%    Lumbar - Left Side Bend limited by 25%    Lumbar - Right Rotation limited by 25%    Lumbar - Left Rotation limited by 25%    Thoracic - Right Side Bend limited by 25%    Thoracic - Left Side Bend limited by 25%      Strength   Overall Strength Comments Bil hip grossly 4/5      Palpation   Spinal mobility Lt Ilium anteriorly rotated    Palpation comment no noted tenderness at low back, spinal segments, gluteals, adductors. reported tenderness at Rt lower abdominal region with some fascial restrictions noted in this area and Lt side of bladder with decreased mobility as well. Decreased  activation noted with Bil transverse abdominus with core activation                        Objective measurements completed on examination: See above findings.     Pelvic Floor Special Questions - 08/08/20 0001     Are you Pregnant or attempting pregnancy? No    Prior Pregnancies Yes    Number of Pregnancies 2    Number of Vaginal Deliveries 2    Any difficulty with labor and deliveries Yes   increased labor time with both births   Episiotomy Performed Yes    Currently Sexually Active Yes    Is this Painful Yes   pain with initial penetration   History of sexually transmitted disease No    Urinary Leakage Yes    How often daily    Pad use no but reports she is going to buy some    Activities that cause leaking Coughing;Sneezing;Laughing;Walking;Running;Exercising    Urinary urgency No    Urinary frequency no    Fecal incontinence No    Caffeine beverages 1-2 cups    Falling out feeling (prolapse) No    Pelvic Floor Internal Exam patient identified and patient confirms consent for PT to perform inernal soft tissue work and muscle strength and integrity assessment    Exam Type Vaginal    Palpation slight tenderness reported at Lt Illioccygeus, pubococcugeus, and obturator internus. (-) on Rt side.    Strength fair squeeze, definite lift    Strength # of reps 8    Strength # of seconds 4    Tone fair                      PT Education - 08/08/20 1201     Education Details Access Code: [email or web address]. Pt educated on female pelvic floor with visual handout for musculature awareness, the knack method, vaginal lubricants, diaphragmatic breathing    Person(s) Educated Patient    Methods Explanation;Demonstration;Tactile cues;Verbal cues;Handout    Comprehension Returned demonstration;Verbalized understanding              PT Short Term Goals - 08/08/20 1219       PT SHORT TERM GOAL #1   Title Pt to be independent with HEP    Time 6    Period Weeks  Status New    Target Date 09/19/20      PT SHORT TERM GOAL #2   Title pt to demonstrate improved vaginal strength to 4/5 in all quadrants for decreased urinary leakage.    Baseline 3/5    Time 6    Period Weeks    Status New    Target Date 09/19/20      PT SHORT TERM GOAL #3   Title pt to demonstrate improved hip strength globally to at least 4+/5 for improved squating ability without leakage.    Time 6    Period Weeks    Status New    Target Date 09/19/20               PT Long Term Goals - 08/08/20 1221       PT LONG TERM GOAL #1   Title Pt to be independent with advanced HEP    Time 4    Period Months    Target Date 12/08/20      PT LONG TERM GOAL #2   Title pt to demonstrate improved internal vaginal strength to at least 5/5 in all quadrants for decreased urinary leakage    Baseline 3/5    Time 4    Period Months    Status New    Target Date 12/08/20      PT LONG TERM GOAL #3   Title pt to report decreased urinary leakage by 75% with coughing, sneezing, lifting, running for improved QOL.    Baseline 100%    Time 4    Period Months    Status New    Target Date 12/08/20      PT LONG TERM GOAL #4   Title pt to report pad use to no more than 3 per week for decreased urinary leakage    Baseline buying pads and plans to wear daily, 3-4 underwear changes per day currently    Time 4    Period Months    Status New    Target Date 12/08/20                    Plan - 08/08/20 1209     Clinical Impression Statement Pt is 60yo female presenting to clinic for pelvic floor PT referal for chronic urinary leakage without constipation or fecal leakage. Pt has had two previous children vaginally with episiotomies and reported long labors. Pt experiences urinary leakage with increased stress on pelvic floor with laughing, coughing, exericsing, lifting, sneezing. Pt reports this is fairly consistent with all these activities and though she does not currently  wear pads she is going to buy them. Pt also educated on difference between urinary incontinence pads and menstral pads. Pt reports vaginal dryness as well, given lubricant educational handout and samples. Pt found to have bil hip weakness, poor breathing mechanics, decreased transverse abdominus activation, anterior rotation of Lt Ilium, Lt lower abdominal and Lt side of bladder decreased mobility, decreased bil hip, lumbar, and thoracic mobility and with internal assessment decreased oelvic floor strength in al four quadrants. Pt would benefit from PT for improved strength, flexibility, breathing technique, and decreased urinary leakage.    Personal Factors and Comorbidities Comorbidity 2;Time since onset of injury/illness/exacerbation;Fitness    Comorbidities histpry of LBP, x2 previous vaginal births    Examination-Activity Limitations Locomotion Level;Squat;Lift;Continence    Examination-Participation Restrictions Community Activity;Interpersonal Relationship    Stability/Clinical Decision Making Stable/Uncomplicated    Clinical Decision Making Low    Rehab Potential  Poor    PT Frequency Biweekly    PT Duration Other (comment)   for 12 visits   PT Treatment/Interventions ADLs/Self Care Home Management;Functional mobility training;Therapeutic activities;Therapeutic exercise;Neuromuscular re-education;Manual techniques;Energy conservation;Passive range of motion;Scar mobilization;Patient/family education    PT Next Visit Plan go over HEP, core and hip strength, kegal training    PT Home Exercise Plan Access Code: [email or web address]    Consulted and Agree with Plan of Care Patient             Patient will benefit from skilled therapeutic intervention in order to improve the following deficits and impairments:  Decreased activity tolerance, Decreased mobility, Decreased strength, Postural dysfunction, Improper body mechanics, Impaired flexibility, Decreased scar mobility, Decreased endurance, Increased  fascial restricitons, Decreased coordination, Decreased range of motion  Visit Diagnosis: Muscle weakness (generalized) - Plan: PT plan of care cert/re-cert  Other abnormalities of gait and mobility - Plan: PT plan of care cert/re-cert     Problem List Patient Active Problem List   Diagnosis Date Noted   Abnormal ECG 07/24/2019   Colon polyps 01/20/2019   Erosion of gastroesophageal junction 11/26/2017   Gastroesophageal reflux disease with esophagitis 11/26/2017   Gastritis and duodenitis 08/10/2017   Acquired hypothyroidism 04/08/2016   Anxiety 04/08/2016   Hypertension 04/08/2016    Otelia Sergeant, PT 08/09/2210:31 PM   [facility] [address] [place], [place], [address] Phone: [phone]   Fax:  [phone]  Name: Areebah Meinders MRN: [id number] Date of Birth: [birth year]

## 2020-08-08 NOTE — Patient Instructions (Addendum)
Lubrication Used for intercourse to reduce friction Avoid ones that have glycerin, warming gels, tingling gels, icing or cooling gel, scented Avoid parabens due to a preservative similar to female sex hormone May need to be reapplied once or several times during sexual activity Can be applied to both partners genitals prior to vaginal penetration to minimize friction or irritation Prevent irritation and mucosal tears that cause post coital pain and increased the risk of vaginal and urinary tract infections Oil-based lubricants cannot be used with condoms due to breaking them down.  Least likely to irritate vaginal tissue.  Plant based-lubes are safe Silicone-based lubrication are thicker and last long and used for post-menopausal women  Vaginal Lubricators Here is a list of some suggested lubricators you can use for intercourse. Use the most hypoallergenic product.  You can place on you or your partner.  Slippery Stuff ( water based) Sylk or Sliquid Natural H2O ( good  if frequent UTI's)- walmart, amazon Sliquid organics silk-(aloe and silicone based ) [employer] (www.blossom-organics.com)- (aloe based ) Coconut oil, olive oil -not good with condoms  PJur Woman Nude- (water based) amazon Uberlube- ( silicon) Amazon Aloe Vera- Sprouts has an organic one Yes lubricant- (water based and has plant oil based similar to silicone) [employer] Platinum-Silicone, Target, [facility] Olive and Bee intimate cream-  www.oliveandbee.com.au Pink - [employer] stuff Erosense Sync- walmart, Sim Boast Things to avoid in lubricants are glycerin, warming gels, tingling gels, icing or cooling  gels, and scented gels.  Also avoid Vaseline. KY jelly, Replens, and Astroglide contain chlorhexidine which kills good bacteria(lactobacilli)  Things to avoid in the vaginal area Do not use things to irritate the vulvar area No lotions- see below Soaps you  can use :Aveeno, [place], Good Clean Love cleanser if  needed. Must be gentle No deodorants No douches Good to sleep without underwear to let the vaginal area to air out No scrubbing: spread the lips to let warm water rinse over labias and pat dry  Creams that can be used on the Vulva Area V [employer], walmart Vital V [place] Salve Julva- [employer] Botanical Pro-Meno Wild [place] Cream Coconut oil, olive oil Cleo by [employer] labial moisturizer -Amazon,  [place] Releveum ( lidocaine) or [place] Gele Yes Moisturizer     THE KNACK  The Knack is a strategy you may use to help to reduce or prevent leakage or passing of urine, gas or feces during an activity that causes downward force on the pelvic floor muscles.    Activities that can cause downward pressure on the pelvic floor muscles include coughing, sneezing, laughing, bending, lifting, and transitioning from different body positions such as from laying down to sitting up and sitting to standing.  To perform The Knack, consciously squeeze and lift your pelvic floor muscles to perform a strong, well-timed pelvic muscle contraction BEFORE AND DURING these activities above.  As your contraction gets more coordinated and your muscles get stronger, you will become more effective in controlling your experience of incontinence or gas passing during these activities.     Access Code: [email or web address] URL: [email or web address] Date: 08/08/2020 Prepared by: Otelia Sergeant  Exercises Supine Diaphragmatic Breathing - 1 x daily - 7 x weekly - 2 sets - 10 reps Diaphragmatic Breathing in Child's Pose with Pelvic Floor Relaxation - 1 x daily - 7 x weekly - 2 sets - 3 reps - 45s hold Seated Hamstring Stretch - 1 x daily - 7 x weekly - 2 sets - 3  reps - 45s hold Supine Figure 4 Piriformis Stretch - 1 x daily - 7 x weekly - 2 sets - 3 reps - 45s hold Supine Hip Adductor Stretch - 1 x daily - 7 x weekly - 2 sets - 3 reps - 45s hold Supine Transversus Abdominis Bracing - Hands on  Ground - 1 x daily - 7 x weekly - 2 sets - 10 reps - 3 hold V Sit Hip Adductor Hamstring Stretch - 1 x daily - 7 x weekly - 3 sets - 10 reps

## 2020-08-22 ENCOUNTER — Ambulatory Visit: Payer: BC Managed Care – PPO | Attending: Family Medicine | Admitting: Physical Therapy

## 2020-08-22 ENCOUNTER — Encounter: Payer: Self-pay | Admitting: Physical Therapy

## 2020-08-22 ENCOUNTER — Other Ambulatory Visit: Payer: Self-pay

## 2020-08-22 DIAGNOSIS — R2689 Other abnormalities of gait and mobility: Secondary | ICD-10-CM | POA: Insufficient documentation

## 2020-08-22 DIAGNOSIS — R279 Unspecified lack of coordination: Secondary | ICD-10-CM | POA: Diagnosis not present

## 2020-08-22 DIAGNOSIS — M6281 Muscle weakness (generalized): Secondary | ICD-10-CM

## 2020-08-22 NOTE — Therapy (Addendum)
[facility] [address] [place], [place], [address] Phone: [phone]   Fax:  [phone]  Physical Therapy Treatment  Patient Details  Name: Cynthia Bolton MRN: [id number] Date of Birth: Feb 11, 1960 Referring Provider (PT): Caren Macadam, MD   Encounter Date: 08/22/2020   PT End of Session - 08/22/20 1410     Visit Number 2    Number of Visits [place] - Visit Number 2    Authorization - Number of Visits 30    PT Start Time 2706    PT Stop Time 2376    PT Time Calculation (min) 43 min    Activity Tolerance Patient tolerated treatment well;No increased pain    Behavior During Therapy [facility] for tasks assessed/performed             Past Medical History:  Diagnosis Date   Allergy    Anxiety    Arthritis    GERD (gastroesophageal reflux disease)    Hyperlipidemia    Hypertension    Thyroid disease     Past Surgical History:  Procedure Laterality Date   KNEE SURGERY Left 2009   left knee scope   linx device  2021   linx device inserted for GERD   SHOULDER SURGERY Right 2008   right shoulder scope   SHOULDER SURGERY Left 2012   left shoulder scope   TONSILECTOMY, ADENOIDECTOMY, BILATERAL MYRINGOTOMY AND TUBES  1968    There were no vitals filed for this visit.   Subjective Assessment - 08/22/20 1407     Subjective Pt reports she feels that her leakage has been better the last 2-3 days, with only having one small leak in the last three days. Has been doing HEP.    Currently in Pain? No/denies                               [facility] Adult PT Treatment/Exercise - 08/22/20 0001       Neuro Re-ed    Neuro Re-ed Details  diaphragmatic breathing in supine hooklying, sitting and quad with tactile cues for improved pattern and coordination x10 each position      Exercises   Exercises Knee/Hip;Lumbar      Lumbar Exercises: Standing    Functional Squats 10 reps    Functional Squats Limitations 15# kettle bell with proper floor relax/contraction and breathing mechanics    Other Standing Lumbar Exercises palloffs at [place] 25# x10 each way with added rotation at trunk, TA activation and proper breathing technique    Other Standing Lumbar Exercises farmers carry 25# and 15# kettle bell one in each hand with squat to pick up from floor and return to floor each time and 40' x3 to complete. VC for trunk at midline proper breathing mechanics and core activation      Lumbar Exercises: Seated   Other Seated Lumbar Exercises palloffs with green band x10 with TA activation and VC for breathing technique. pt reported this did not feel challenging, progressed to standing with increased weight resistance.                    PT Education - 08/22/20 1448     Education Details Access Code: [email or web address]. Pt had questions regarding HEP, reviewed these with proper technique reinforced and pt verbalized understanding. Pt also educated on proper breathing mechanics and TA  activation in various positions with good effect.    Person(s) Educated Patient    Methods Explanation;Demonstration;Tactile cues;Verbal cues    Comprehension Verbalized understanding;Returned demonstration              PT Short Term Goals - 08/08/20 1219       PT SHORT TERM GOAL #1   Title Pt to be independent with HEP    Time 6    Period Weeks    Status New    Target Date 09/19/20      PT SHORT TERM GOAL #2   Title pt to demonstrate improved vaginal strength to 4/5 in all quadrants for decreased urinary leakage.    Baseline 3/5    Time 6    Period Weeks    Status New    Target Date 09/19/20      PT SHORT TERM GOAL #3   Title pt to demonstrate improved hip strength globally to at least 4+/5 for improved squating ability without leakage.    Time 6    Period Weeks    Status New    Target Date 09/19/20               PT Long Term Goals -  08/08/20 1221       PT LONG TERM GOAL #1   Title Pt to be independent with advanced HEP    Time 4    Period Months    Target Date 12/08/20      PT LONG TERM GOAL #2   Title pt to demonstrate improved internal vaginal strength to at least 5/5 in all quadrants for decreased urinary leakage    Baseline 3/5    Time 4    Period Months    Status New    Target Date 12/08/20      PT LONG TERM GOAL #3   Title pt to report decreased urinary leakage by 75% with coughing, sneezing, lifting, running for improved QOL.    Baseline 100%    Time 4    Period Months    Status New    Target Date 12/08/20      PT LONG TERM GOAL #4   Title pt to report pad use to no more than 3 per week for decreased urinary leakage    Baseline buying pads and plans to wear daily, 3-4 underwear changes per day currently    Time 4    Period Months    Status New    Target Date 12/08/20                   Plan - 08/22/20 1450     Clinical Impression Statement Pt presented to clinic reporting she felt slight improvement with leakage since evaluation with implementation of education and techniques from then. Pt reports only having one instance of leakage in previous 3 days. This session emphasized core activation and proper breathing mechanics with various functional exercises. Pt reports she feels doing internal pelvic work as well as strengthening would be beneficial for her to be able to implement both techniques for overall improvement with symptoms. Pt denied any leakage with all activity during session and pt able to complete all tasks well with moderate cues for technique and sequencing. Pt would benefit from continued PT for improvement with stength, endurance, flexibility, breathing mechanics, and increased independence with ability to complete all tasks without urinary leakage.    Personal Factors and Comorbidities Comorbidity 2;Time since onset of injury/illness/exacerbation;Fitness  Comorbidities  histpry of LBP, x2 previous vaginal births    Examination-Activity Limitations Locomotion Level;Squat;Lift;Continence    Examination-Participation Restrictions Community Activity;Interpersonal Relationship    Stability/Clinical Decision Making Stable/Uncomplicated    Clinical Decision Making Low    Rehab Potential Excellent    PT Frequency Biweekly    PT Duration Other (comment)    PT Treatment/Interventions ADLs/Self Care Home Management;Functional mobility training;Therapeutic activities;Therapeutic exercise;Neuromuscular re-education;Manual techniques;Energy conservation;Passive range of motion;Scar mobilization;Patient/family education    PT Next Visit Plan go over HEP, core and hip strength, kegal training    PT Home Exercise Plan Access Code: [email or web address]    Consulted and Agree with Plan of Care Patient             Patient will benefit from skilled therapeutic intervention in order to improve the following deficits and impairments:  Decreased activity tolerance, Decreased mobility, Decreased strength, Postural dysfunction, Improper body mechanics, Impaired flexibility, Decreased scar mobility, Decreased endurance, Increased fascial restricitons, Decreased coordination, Decreased range of motion  Visit Diagnosis: Unspecified lack of coordination  Muscle weakness (generalized)  Other abnormalities of gait and mobility     Problem List Patient Active Problem List   Diagnosis Date Noted   Abnormal ECG 07/24/2019   Colon polyps 01/20/2019   Erosion of gastroesophageal junction 11/26/2017   Gastroesophageal reflux disease with esophagitis 11/26/2017   Gastritis and duodenitis 08/10/2017   Acquired hypothyroidism 04/08/2016   Anxiety 04/08/2016   Hypertension 04/08/2016    Junie Panning 08/22/2020, 2:56 PM  PHYSICAL THERAPY DISCHARGE SUMMARY  Visits from Start of Care: 08/08/20  Current functional level related to goals / functional outcomes: Pt unable to complete POC  and unable to reassess goals.   Remaining deficits: Unable to formally reassess due to pt unable to maintain appointments   Education / Equipment: HEP   Patient agrees to discharge. Patient goals were partially met. Patient is being discharged due to the patient's request. Thank you for the referral.  Stacy Gardner, PT 08/24/224:59 PM   [facility] [address] [place], [place], [address] Phone: [phone]   Fax:  [phone]  Name: Cynthia Bolton MRN: [id number] Date of Birth: February 24, 1960

## 2020-09-05 ENCOUNTER — Ambulatory Visit: Payer: BC Managed Care – PPO | Admitting: Physical Therapy

## 2020-09-05 ENCOUNTER — Encounter: Payer: BC Managed Care – PPO | Admitting: Physical Therapy

## 2020-09-09 HISTORY — PX: FINGER SURGERY: SHX640

## 2020-09-19 ENCOUNTER — Encounter: Payer: BC Managed Care – PPO | Admitting: Physical Therapy

## 2020-09-23 DIAGNOSIS — L814 Other melanin hyperpigmentation: Secondary | ICD-10-CM | POA: Diagnosis not present

## 2020-09-23 DIAGNOSIS — L821 Other seborrheic keratosis: Secondary | ICD-10-CM | POA: Diagnosis not present

## 2020-09-23 DIAGNOSIS — M67441 Ganglion, right hand: Secondary | ICD-10-CM | POA: Diagnosis not present

## 2020-09-27 DIAGNOSIS — M67441 Ganglion, right hand: Secondary | ICD-10-CM | POA: Diagnosis not present

## 2020-09-27 DIAGNOSIS — R2231 Localized swelling, mass and lump, right upper limb: Secondary | ICD-10-CM | POA: Diagnosis not present

## 2020-10-02 DIAGNOSIS — I10 Essential (primary) hypertension: Secondary | ICD-10-CM | POA: Diagnosis not present

## 2020-10-02 DIAGNOSIS — M67441 Ganglion, right hand: Secondary | ICD-10-CM | POA: Diagnosis not present

## 2020-10-02 DIAGNOSIS — Z87891 Personal history of nicotine dependence: Secondary | ICD-10-CM | POA: Diagnosis not present

## 2020-10-02 DIAGNOSIS — E039 Hypothyroidism, unspecified: Secondary | ICD-10-CM | POA: Diagnosis not present

## 2020-10-02 DIAGNOSIS — R2231 Localized swelling, mass and lump, right upper limb: Secondary | ICD-10-CM | POA: Diagnosis not present

## 2020-10-02 DIAGNOSIS — Z79899 Other long term (current) drug therapy: Secondary | ICD-10-CM | POA: Diagnosis not present

## 2020-10-02 DIAGNOSIS — Z79890 Hormone replacement therapy (postmenopausal): Secondary | ICD-10-CM | POA: Diagnosis not present

## 2020-10-03 ENCOUNTER — Encounter: Payer: BC Managed Care – PPO | Admitting: Physical Therapy

## 2020-10-04 ENCOUNTER — Ambulatory Visit (INDEPENDENT_AMBULATORY_CARE_PROVIDER_SITE_OTHER): Payer: BC Managed Care – PPO | Admitting: Obstetrics and Gynecology

## 2020-10-04 ENCOUNTER — Other Ambulatory Visit (HOSPITAL_COMMUNITY)
Admission: RE | Admit: 2020-10-04 | Discharge: 2020-10-04 | Disposition: A | Discharge status: Home / Self Care | Payer: BC Managed Care – PPO | Source: Ambulatory Visit | Attending: Obstetrics and Gynecology | Admitting: Obstetrics and Gynecology

## 2020-10-04 ENCOUNTER — Other Ambulatory Visit: Payer: Self-pay

## 2020-10-04 ENCOUNTER — Encounter: Payer: Self-pay | Admitting: Obstetrics and Gynecology

## 2020-10-04 VITALS — BP 100/71 | HR 67 | Ht 63.0 in | Wt 142.0 lb

## 2020-10-04 DIAGNOSIS — N898 Other specified noninflammatory disorders of vagina: Secondary | ICD-10-CM

## 2020-10-04 DIAGNOSIS — R350 Frequency of micturition: Secondary | ICD-10-CM | POA: Diagnosis not present

## 2020-10-04 DIAGNOSIS — N393 Stress incontinence (female) (male): Secondary | ICD-10-CM | POA: Diagnosis not present

## 2020-10-04 LAB — POCT URINALYSIS DIPSTICK
Appearance: NORMAL
Bilirubin, UA: NEGATIVE
Blood, UA: NEGATIVE
Glucose, UA: NEGATIVE
Ketones, UA: NEGATIVE
Nitrite, UA: NEGATIVE
Protein, UA: NEGATIVE
Spec Grav, UA: 1.02 (ref 1.010–1.025)
Urobilinogen, UA: 0.2 E.U./dL
pH, UA: 7 (ref 5.0–8.0)

## 2020-10-04 NOTE — Patient Instructions (Signed)
For treatment of stress urinary incontinence, which is leakage with physical activity/movement/strainging/coughing, we discussed expectant management versus nonsurgical options versus surgery. Nonsurgical options include weight loss, physical therapy, as well as a pessary.  Surgery is also an option.
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
## 2020-10-08 LAB — CERVICOVAGINAL ANCILLARY ONLY
Bacterial Vaginitis (gardnerella): NEGATIVE
Candida Glabrata: NEGATIVE
Candida Vaginitis: NEGATIVE
Chlamydia: NEGATIVE
Comment: NEGATIVE
Comment: NEGATIVE
Comment: NEGATIVE
Comment: NEGATIVE
Comment: NEGATIVE
Comment: NORMAL
Neisseria Gonorrhea: NEGATIVE
Trichomonas: NEGATIVE

## 2020-10-16 ENCOUNTER — Encounter: Payer: Self-pay | Admitting: Obstetrics and Gynecology

## 2020-10-16 ENCOUNTER — Ambulatory Visit (INDEPENDENT_AMBULATORY_CARE_PROVIDER_SITE_OTHER): Payer: BC Managed Care – PPO | Admitting: Obstetrics and Gynecology

## 2020-10-16 ENCOUNTER — Other Ambulatory Visit: Payer: Self-pay

## 2020-10-16 VITALS — BP 102/67 | HR 60 | Ht 63.0 in | Wt 142.0 lb

## 2020-10-16 DIAGNOSIS — N393 Stress incontinence (female) (male): Secondary | ICD-10-CM

## 2020-10-16 NOTE — Progress Notes (Addendum)
[facility]   Subjective:     Chief Complaint: pessary fitting  History of Present Illness: Cynthia Bolton is a 60 y.o. female with stress incontinence who presents today for a pessary fitting.    Past Medical History: Patient  has a past medical history of Allergy, Anxiety, Arthritis, GERD (gastroesophageal reflux disease), Hyperlipidemia, Hypertension, and Thyroid disease.   Past Surgical History: She  has a past surgical history that includes Tonsilectomy, adenoidectomy, bilateral myringotomy and tubes (1968); Shoulder surgery (Right, 2008); Knee surgery (Left, 2009); Shoulder surgery (Left, 2012); linx device (2021); and Finger surgery (09/2020).   Medications: She has a current medication list which includes the following prescription(s): desvenlafaxine, ibuprofen, levothyroxine, olmesartan, valacyclovir, and cholecalciferol.   Allergies: Patient is allergic to sulfa antibiotics.   Social History: Patient  reports that she quit smoking about 5 years ago. Her smoking use included cigarettes. She has a 3.00 pack-year smoking history. She has never used smokeless tobacco. She reports that she does not currently use alcohol. She reports current drug use. Drug: Marijuana.      Objective:    BP 102/67   Pulse 60   Ht 5\' 3"  (1.6 m)   Wt 142 lb (64.4 kg)   BMI 25.15 kg/m  Gen: No apparent distress, A&O x 3. Pelvic Exam: Normal external female genitalia; Bartholin's and Skene's glands normal in appearance; urethral meatus normal in appearance, no urethral masses or discharge.   A size 1 incontinence ring pessary was fitted. It was comfortable, stayed in place with valsalva and standing and was an appropriate size on examination, with one finger fitting between the pessary and the vaginal walls. The patient demonstrated proper removal and replacement. Lot # , Exp 10/09/24  POP-Q (10/04/20):    POP-Q   -2                                            Aa    -2                                           Ba   -6                                              C    2.5                                            Gh   3                                            Pb   10                                            tvl    -2.5  Ap   -2.5                                            Bp   -8.5                                              D        Assessment/Plan:    Assessment: Cynthia Bolton is a 60 y.o. with stress incontinence who presents for a pessary fitting. Plan: She was fitted with a 1 incontinence ring pessary. She will remove at least weekly .   Follow-up in 3 weeks for a pessary check or sooner as needed.  All questions were answered.    Marguerita Beards, MD

## 2020-10-16 NOTE — Patient Instructions (Signed)
You were fitted with a incontinence ring pessary.  Come back in 2-3 weeks to see how it is working.  You can take it out every week, or sooner if you desire. Clean with liquid soap and water in between uses and leave out over night on the night that you remove it. Call for any problems.

## 2020-10-17 ENCOUNTER — Encounter: Payer: BC Managed Care – PPO | Admitting: Physical Therapy

## 2020-10-31 ENCOUNTER — Encounter: Payer: BC Managed Care – PPO | Admitting: Physical Therapy

## 2020-11-07 ENCOUNTER — Encounter: Payer: Self-pay | Admitting: Family Medicine

## 2020-11-07 ENCOUNTER — Telehealth (INDEPENDENT_AMBULATORY_CARE_PROVIDER_SITE_OTHER): Payer: BC Managed Care – PPO | Admitting: Family Medicine

## 2020-11-07 DIAGNOSIS — I10 Essential (primary) hypertension: Secondary | ICD-10-CM | POA: Diagnosis not present

## 2020-11-07 MED ORDER — OLMESARTAN MEDOXOMIL 40 MG PO TABS: 40.0000 mg | ORAL_TABLET | Freq: Every day | ORAL | 0 refills | Status: DC

## 2020-11-07 NOTE — Progress Notes (Signed)
   Virtual Visit via video Note   Due to COVID-19 pandemic this visit was conducted virtually. This visit type was conducted due to national recommendations for restrictions regarding the COVID-19 Pandemic (e.g. social distancing, sheltering in place) in an effort to limit this patient's exposure and mitigate transmission in our community. All issues noted in this document were discussed and addressed.  A physical exam was not performed with this format.  I connected with  Cynthia Bolton  on 11/07/20 at 1142 by video and verified that I am speaking with the correct person using two identifiers. Cynthia Bolton is currently located at home and no one is currently with her during her visit. The provider, Gabriel Earing, FNP is located in their office at time of visit.  I discussed the limitations, risks, security and privacy concerns of performing an evaluation and management service by video  and the availability of in person appointments. I also discussed with the patient that there may be a patient responsible charge related to this service. The patient expressed understanding and agreed to proceed.  CC: HTN  History and Present Illness:  Cynthia Bolton reports that she has been doing well. She had an in person chronic follow up appointment next week but will be unable to come as she will be out of town. She is in need a a refill on her BP medication. She take olmesartan daily. She reports doing well on this without side effects. She checks her BP a few times a week. It is usually 120s/80s when she checks it. She denies chest pain, shortness of breath, edema, dizziness, palpitations, or changes in vision.    ROS As per HPI.     Observations/Objective: Alert and oriented x 3. Able to speak in full sentences without difficulty.   Assessment and Plan: [name] was seen today for hypertension.  Diagnoses and all orders for this visit:  Primary hypertension Well controlled on current regimen.  Refill provided.  -     olmesartan (BENICAR) 40 MG tablet; Take 1 tablet (40 mg total) by mouth daily.    Follow Up Instructions: Return in about 6 months (around 05/12/2021) for CPE.     I discussed the assessment and treatment plan with the patient. The patient was provided an opportunity to ask questions and all were answered. The patient agreed with the plan and demonstrated an understanding of the instructions.   The patient was advised to call back or seek an in-person evaluation if the symptoms worsen or if the condition fails to improve as anticipated.  The above assessment and management plan was discussed with the patient. The patient verbalized understanding of and has agreed to the management plan. Patient is aware to call the clinic if symptoms persist or worsen. Patient is aware when to return to the clinic for a follow-up visit. Patient educated on when it is appropriate to go to the emergency department.   Time call ended: 1158  I provided 14 minutes of face-to-face time during this encounter.    Gabriel Earing, FNP

## 2020-11-11 ENCOUNTER — Ambulatory Visit: Payer: BC Managed Care – PPO | Admitting: Family Medicine

## 2020-11-14 ENCOUNTER — Encounter: Payer: BC Managed Care – PPO | Admitting: Physical Therapy

## 2020-11-18 ENCOUNTER — Other Ambulatory Visit: Payer: Self-pay

## 2020-11-18 ENCOUNTER — Ambulatory Visit (INDEPENDENT_AMBULATORY_CARE_PROVIDER_SITE_OTHER): Payer: BC Managed Care – PPO | Admitting: Obstetrics and Gynecology

## 2020-11-18 ENCOUNTER — Encounter: Payer: Self-pay | Admitting: Obstetrics and Gynecology

## 2020-11-18 VITALS — BP 123/76 | HR 57

## 2020-11-18 DIAGNOSIS — N393 Stress incontinence (female) (male): Secondary | ICD-10-CM | POA: Diagnosis not present

## 2020-11-18 NOTE — Progress Notes (Signed)
[facility]   Subjective:     Chief Complaint:  Chief Complaint  Patient presents with   Follow-up    Cynthia Bolton is a 60 y.o. female here for a f/u. Pt said she does not like the pessary because it discharge on it and noticed her urethra was red.   History of Present Illness: Cynthia Bolton is a 60 y.o. female with stress incontinence who presents for a pessary check. She is using a size 1 incontinence ring pessary. After a few days she removed the pessary and noticed a lot of discharge. She decided she no longer wants to use it. The opening at the urethra had been very red after.   Has done PT in [place] before, has not done the exercises recently.   Past Medical History: Patient  has a past medical history of Allergy, Anxiety, Arthritis, GERD (gastroesophageal reflux disease), Hyperlipidemia, Hypertension, and Thyroid disease.   Past Surgical History: She  has a past surgical history that includes Tonsilectomy, adenoidectomy, bilateral myringotomy and tubes (1968); Shoulder surgery (Right, 2008); Knee surgery (Left, 2009); Shoulder surgery (Left, 2012); linx device (2021); and Finger surgery (09/2020).   Medications: She has a current medication list which includes the following prescription(s): cholecalciferol, desvenlafaxine, ibuprofen, levothyroxine, olmesartan, and valacyclovir.   Allergies: Patient is allergic to sulfa antibiotics.   Social History: Patient  reports that she quit smoking about 5 years ago. Her smoking use included cigarettes. She has a 3.00 pack-year smoking history. She has never used smokeless tobacco. She reports that she does not currently use alcohol. She reports current drug use. Drug: Marijuana.      Objective:    Physical Exam: BP 123/76   Pulse (!) 57  Gen: No apparent distress, A&O x 3. Detailed Urogynecologic Evaluation:  Pelvic Exam: Normal external female genitalia; Bartholin's and Skene's glands normal in  appearance; urethral meatus normal in appearance, no urethral masses or discharge. Speculum exam revealed normal vaginal mucosa and no lesions in the vagina.     Assessment/Plan:    Assessment: Ms. Oka is a 60 y.o. with stress incontinence here for a pessary check. She is doing well.  Plan: - She would like to continue with pelvic floor exercises at home at this time. We briefly reviewed surgical options but she wants to wait to see if she has improvement with exercises first.   Follow up as needed   Time Spent: I spent 15 minutes dedicated to the care of this patient on the date of this encounter to include pre-visit review of records, face-to-face time with the patient and post visit documentation.

## 2020-11-28 ENCOUNTER — Encounter: Payer: BC Managed Care – PPO | Admitting: Physical Therapy

## 2020-12-12 ENCOUNTER — Encounter: Payer: BC Managed Care – PPO | Admitting: Physical Therapy

## 2020-12-26 ENCOUNTER — Encounter: Payer: BC Managed Care – PPO | Admitting: Physical Therapy

## 2021-01-09 ENCOUNTER — Encounter: Payer: BC Managed Care – PPO | Admitting: Physical Therapy

## 2021-01-23 ENCOUNTER — Encounter: Payer: BC Managed Care – PPO | Admitting: Physical Therapy

## 2021-02-20 ENCOUNTER — Other Ambulatory Visit: Payer: Self-pay | Admitting: Family Medicine

## 2021-02-20 DIAGNOSIS — I10 Essential (primary) hypertension: Secondary | ICD-10-CM

## 2021-02-20 MED ORDER — LEVOTHYROXINE SODIUM 50 MCG PO TABS: 50.0000 ug | ORAL_TABLET | Freq: Every day | ORAL | 0 refills | Status: DC

## 2021-02-20 MED ORDER — DESVENLAFAXINE SUCCINATE ER 50 MG PO TB24: 50.0000 mg | ORAL_TABLET | Freq: Every day | ORAL | 0 refills | Status: DC

## 2021-02-20 NOTE — Telephone Encounter (Signed)
One month supply ordered, needs appointment and labs for further refills.

## 2021-02-20 NOTE — Telephone Encounter (Signed)
°  Prescription Request  02/20/2021  Is this a "Controlled Substance" medicine? no  Have you seen your PCP in the last 2 weeks? Oct 22  If YES, route message to pool  -  If NO, patient needs to be scheduled for appointment.  What is the name of the medication or equipment? Pristiq and levothyroxine  Have you contacted your pharmacy to request a refill? yes   Which pharmacy would you like this sent to? [facility]   Patient notified that their request is being sent to the clinical staff for review and that they should receive a response within 2 business days.

## 2021-02-20 NOTE — Telephone Encounter (Signed)
Pt aware 30 day refill sent in. Appt sched for 03/12/21

## 2021-03-12 ENCOUNTER — Ambulatory Visit (INDEPENDENT_AMBULATORY_CARE_PROVIDER_SITE_OTHER): Payer: BC Managed Care – PPO | Admitting: Family Medicine

## 2021-03-12 ENCOUNTER — Encounter: Payer: Self-pay | Admitting: Family Medicine

## 2021-03-12 VITALS — BP 113/72 | HR 64 | Temp 97.2°F | Ht 63.0 in | Wt 130.0 lb

## 2021-03-12 DIAGNOSIS — E039 Hypothyroidism, unspecified: Secondary | ICD-10-CM

## 2021-03-12 DIAGNOSIS — I10 Essential (primary) hypertension: Secondary | ICD-10-CM

## 2021-03-12 DIAGNOSIS — E559 Vitamin D deficiency, unspecified: Secondary | ICD-10-CM | POA: Diagnosis not present

## 2021-03-12 DIAGNOSIS — F419 Anxiety disorder, unspecified: Secondary | ICD-10-CM

## 2021-03-12 DIAGNOSIS — E875 Hyperkalemia: Secondary | ICD-10-CM

## 2021-03-12 DIAGNOSIS — A6004 Herpesviral vulvovaginitis: Secondary | ICD-10-CM | POA: Diagnosis not present

## 2021-03-12 MED ORDER — VALACYCLOVIR HCL 500 MG PO TABS: 500.0000 mg | ORAL_TABLET | ORAL | 2 refills | Status: AC | PRN

## 2021-03-12 NOTE — Progress Notes (Addendum)
Subjective:  Patient ID: Cynthia Bolton, female    DOB: [birth year], 61 y.o.   MRN: [id number]  Patient Care Team: Gwenlyn Perking, FNP as PCP - General (Family Medicine)   Chief Complaint:  Medical Management of Chronic Issues   HPI: Cynthia Bolton is a 61 y.o. female presenting on 03/12/2021 for Medical Management of Chronic Issues   Pt presents for a follow up of hypothyroidism, hypertension, anxiety. Anxiety is controlled with current Pristiq dose. Denies changes to vision, palpitations, or swelling in legs.  Her thyroid has been well controlled. Denies hypo- or hyperthyroid symptoms.  Blood pressure is well controlled. No headaches, chest pain, shortness of breath, visual changes, or headaches.  She is on Vit D repletion therapy, no recent labs. She states she has genital herpes and is out of her as needed Valtrex, requesting refills.    Relevant past medical, surgical, family, and social history reviewed and updated as indicated.  Allergies and medications reviewed and updated. Data reviewed: Chart in Epic.   Past Medical History:  Diagnosis Date   Allergy    Anxiety    Arthritis    GERD (gastroesophageal reflux disease)    Hyperlipidemia    Hypertension    Thyroid disease     Past Surgical History:  Procedure Laterality Date   FINGER SURGERY  09/2020   KNEE SURGERY Left 2009   left knee scope   linx device  2021   linx device inserted for GERD   SHOULDER SURGERY Right 2008   right shoulder scope   SHOULDER SURGERY Left 2012   left shoulder scope   TONSILECTOMY, ADENOIDECTOMY, BILATERAL MYRINGOTOMY AND TUBES  1968    Social History   Socioeconomic History   Marital status: Divorced    Spouse name: Not on file   Number of children: 2   Years of education: 12   Highest education level: High school graduate  Occupational History   Occupation: [employer]  Tobacco Use   Smoking status: Former    Packs/day: 1.00    Years: 3.00    Pack years:  3.00    Types: Cigarettes    Quit date: 02/2015    Years since quitting: 6.0   Smokeless tobacco: Never  Vaping Use   Vaping Use: Never used  Substance and Sexual Activity   Alcohol use: Yes    Comment: 2-3 drinks a week   Drug use: Not Currently    Types: Marijuana    Comment: to sleep at night/gummies   Sexual activity: Yes    Birth control/protection: Post-menopausal  Other Topics Concern   Not on file  Social History Narrative   Not on file   Social Determinants of Health   Financial Resource Strain: Not on file  Food Insecurity: No Food Insecurity   Worried About Running Out of Food in the Last Year: Never true   Kyle in the Last Year: Never true  Transportation Needs: No Transportation Needs   Lack of Transportation (Medical): No   Lack of Transportation (Non-Medical): No  Physical Activity: Not on file  Stress: Not on file  Social Connections: Not on file  Intimate Partner Violence: Not on file    Outpatient Encounter Medications as of 03/12/2021  Medication Sig   desvenlafaxine (PRISTIQ) 50 MG 24 hr tablet Take 1 tablet (50 mg total) by mouth daily.   ibuprofen (ADVIL) 600 MG tablet Take 600 mg by mouth as needed.  levothyroxine (SYNTHROID) 50 MCG tablet Take 1 tablet (50 mcg total) by mouth daily.   olmesartan (BENICAR) 40 MG tablet Take 1 tablet (40 mg total) by mouth daily.   [DISCONTINUED] valACYclovir (VALTREX) 500 MG tablet Take 500 mg by mouth as needed.   valACYclovir (VALTREX) 500 MG tablet Take 1 tablet (500 mg total) by mouth as needed.   [DISCONTINUED] Cholecalciferol 25 MCG (1000 UT) tablet Take 1,000 Units by mouth daily. (Patient not taking: No sig reported)   No facility-administered encounter medications on file as of 03/12/2021.    Allergies  Allergen Reactions   Sulfa Antibiotics     Review of Systems  Constitutional:  Negative for activity change, appetite change, chills, diaphoresis, fatigue, fever and unexpected weight  change.  HENT: Negative.    Eyes: Negative.  Negative for photophobia and visual disturbance.  Respiratory:  Negative for cough, chest tightness and shortness of breath.   Cardiovascular:  Negative for chest pain, palpitations and leg swelling.  Gastrointestinal:  Negative for abdominal pain, blood in stool, constipation, diarrhea, nausea and vomiting.  Endocrine: Negative.  Negative for cold intolerance, heat intolerance, polydipsia, polyphagia and polyuria.  Genitourinary:  Positive for genital sores. Negative for decreased urine volume, difficulty urinating, dysuria, frequency and urgency.  Musculoskeletal:  Negative for arthralgias, joint swelling and myalgias.  Skin: Negative.   Allergic/Immunologic: Negative.   Neurological:  Negative for dizziness, tremors, seizures, syncope, facial asymmetry, speech difficulty, weakness, light-headedness, numbness and headaches.  Hematological: Negative.   Psychiatric/Behavioral:  Negative for confusion, hallucinations, sleep disturbance and suicidal ideas. The patient is not nervous/anxious.   All other systems reviewed and are negative.      Objective:  BP 113/72    Pulse 64    Temp (!) 97.2 F (36.2 C) (Temporal)    Ht 5\' 3"  (1.6 m)    Wt 59 kg    SpO2 100%    BMI 23.03 kg/m    Wt Readings from Last 3 Encounters:  03/12/21 59 kg  10/16/20 64.4 kg  10/04/20 64.4 kg    Physical Exam Vitals and nursing note reviewed.  Constitutional:      Appearance: Normal appearance. She is normal weight.  HENT:     Head: Normocephalic and atraumatic.  Eyes:     Pupils: Pupils are equal, round, and reactive to light.  Cardiovascular:     Rate and Rhythm: Normal rate and regular rhythm.     Heart sounds: Normal heart sounds.  Pulmonary:     Effort: Pulmonary effort is normal.     Breath sounds: Normal breath sounds.  Abdominal:     General: Abdomen is flat. Bowel sounds are normal.  Musculoskeletal:        General: Normal range of motion.      Cervical back: Normal range of motion and neck supple.  Skin:    General: Skin is warm and dry.     Capillary Refill: Capillary refill takes less than 2 seconds.  Neurological:     General: No focal deficit present.     Mental Status: She is alert. Mental status is at baseline. She is disoriented.  Psychiatric:        Mood and Affect: Mood normal.        Behavior: Behavior normal.        Thought Content: Thought content normal.        Judgment: Judgment normal.    Results for orders placed or performed in visit on 10/04/20  POCT Urinalysis  Dipstick  Result Value Ref Range   Color, UA yellow    Clarity, UA clear    Glucose, UA Negative Negative   Bilirubin, UA neg    Ketones, UA neg    Spec Grav, UA 1.020 1.010 - 1.025   Blood, UA neg    pH, UA 7.0 5.0 - 8.0   Protein, UA Negative Negative   Urobilinogen, UA 0.2 0.2 or 1.0 E.U./dL   Nitrite, UA neg    Leukocytes, UA Trace (A) Negative   Appearance normal    Odor none   Cervicovaginal ancillary only  Result Value Ref Range   Neisseria Gonorrhea Negative    Chlamydia Negative    Trichomonas Negative    Bacterial Vaginitis (gardnerella) Negative    Candida Vaginitis Negative    Candida Glabrata Negative    Comment      Normal Reference Range Bacterial Vaginosis - Negative   Comment Normal Reference Range Candida Species - Negative    Comment Normal Reference Range Candida Galbrata - Negative    Comment Normal Reference Range Trichomonas - Negative    Comment Normal Reference Ranger Chlamydia - Negative    Comment      Normal Reference Range Neisseria Gonorrhea - Negative       Pertinent labs & imaging results that were available during my care of the patient were reviewed by me and considered in my medical decision making.  Assessment & Plan:  Kimberley was seen today for medical management of chronic issues.  Diagnoses and all orders for this visit:  Primary hypertension - BP controlled today, continue current dose  of Benicar.  -Will draw CBC, CMP, Thyroid Panel, and Lipid Panel Acquired hypothyroidism - Continue current levothyroxine dose. Will recheck thyroid panel and adjust as needed.  Anxiety - Continue current dose of Pristiq.  Herpes simplex vulvovaginitis -     valACYclovir (VALTREX) 500 MG tablet; Take 1 tablet (500 mg total) by mouth as needed.     Continue all other maintenance medications.  Follow up plan: Return in about 6 months (around 09/09/2021) for chronic management .   Continue healthy lifestyle choices, including diet (rich in fruits, vegetables, and lean proteins, and low in salt and simple carbohydrates) and exercise (at least 30 minutes of moderate physical activity daily).  Educational handout given for DASH diet.   The above assessment and management plan was discussed with the patient. The patient verbalized understanding of and has agreed to the management plan. Patient is aware to call the clinic if they develop any new symptoms or if symptoms persist or worsen. Patient is aware when to return to the clinic for a follow-up visit. Patient educated on when it is appropriate to go to the emergency department.   Deidre Ala, NP-S  I personally was present during the history, physical exam, and medical decision-making activities of this visit and have verified that the services and findings are accurately documented in the nurse practitioner student's note.  Monia Pouch, FNP-C [facility] [address] [place], [place] [address] [phone]

## 2021-03-12 NOTE — Patient Instructions (Signed)

## 2021-03-13 LAB — THYROID PANEL WITH TSH
Free Thyroxine Index: 2.2 (ref 1.2–4.9)
T3 Uptake Ratio: 27 % (ref 24–39)
T4, Total: 8.2 ug/dL (ref 4.5–12.0)
TSH: 1.74 u[IU]/mL (ref 0.450–4.500)

## 2021-03-13 LAB — CBC WITH DIFFERENTIAL/PLATELET
Basophils Absolute: 0.1 10*3/uL (ref 0.0–0.2)
Basos: 1 %
EOS (ABSOLUTE): 0.1 10*3/uL (ref 0.0–0.4)
Eos: 1 %
Hematocrit: 39.1 % (ref 34.0–46.6)
Hemoglobin: 13.2 g/dL (ref 11.1–15.9)
Immature Grans (Abs): 0 10*3/uL (ref 0.0–0.1)
Immature Granulocytes: 0 %
Lymphocytes Absolute: 2.6 10*3/uL (ref 0.7–3.1)
Lymphs: 35 %
MCH: 30.2 pg (ref 26.6–33.0)
MCHC: 33.8 g/dL (ref 31.5–35.7)
MCV: 90 fL (ref 79–97)
Monocytes Absolute: 0.4 10*3/uL (ref 0.1–0.9)
Monocytes: 5 %
Neutrophils Absolute: 4.3 10*3/uL (ref 1.4–7.0)
Neutrophils: 58 %
Platelets: 299 10*3/uL (ref 150–450)
RBC: 4.37 x10E6/uL (ref 3.77–5.28)
RDW: 12.4 % (ref 11.7–15.4)
WBC: 7.5 10*3/uL (ref 3.4–10.8)

## 2021-03-13 LAB — LIPID PANEL
Chol/HDL Ratio: 3.9 ratio (ref 0.0–4.4)
Cholesterol, Total: 258 mg/dL — ABNORMAL HIGH (ref 100–199)
HDL: 66 mg/dL (ref >39)
LDL Chol Calc (NIH): 171 mg/dL — ABNORMAL HIGH (ref 0–99)
Triglycerides: 121 mg/dL (ref 0–149)
VLDL Cholesterol Cal: 21 mg/dL (ref 5–40)

## 2021-03-13 LAB — CMP14+EGFR
ALT: 16 IU/L (ref 0–32)
AST: 18 IU/L (ref 0–40)
Albumin/Globulin Ratio: 1.9 (ref 1.2–2.2)
Albumin: 4.8 g/dL (ref 3.8–4.9)
Alkaline Phosphatase: 88 IU/L (ref 44–121)
BUN/Creatinine Ratio: 14 (ref 12–28)
BUN: 13 mg/dL (ref 8–27)
Bilirubin Total: 0.4 mg/dL (ref 0.0–1.2)
CO2: 25 mmol/L (ref 20–29)
Calcium: 10.5 mg/dL — ABNORMAL HIGH (ref 8.7–10.3)
Chloride: 101 mmol/L (ref 96–106)
Creatinine, Ser: 0.94 mg/dL (ref 0.57–1.00)
Globulin, Total: 2.5 g/dL (ref 1.5–4.5)
Glucose: 100 mg/dL — ABNORMAL HIGH (ref 70–99)
Potassium: 6.2 mmol/L (ref 3.5–5.2)
Sodium: 140 mmol/L (ref 134–144)
Total Protein: 7.3 g/dL (ref 6.0–8.5)
eGFR: 69 mL/min/{1.73_m2} (ref >59)

## 2021-03-13 LAB — VITAMIN D 25 HYDROXY (VIT D DEFICIENCY, FRACTURES): Vit D, 25-Hydroxy: 36 ng/mL (ref 30.0–100.0)

## 2021-03-13 MED ORDER — SODIUM POLYSTYRENE SULFONATE 15 GM/60ML PO SUSP: 15.0000 g | Freq: Once | ORAL | 0 refills | Status: DC

## 2021-03-13 NOTE — Addendum Note (Signed)
Addended by: Sonny Masters on: 03/13/2021 08:00 AM   Modules accepted: Orders

## 2021-03-14 ENCOUNTER — Other Ambulatory Visit: Payer: BC Managed Care – PPO

## 2021-03-14 ENCOUNTER — Telehealth: Payer: Self-pay | Admitting: Family Medicine

## 2021-03-14 DIAGNOSIS — E875 Hyperkalemia: Secondary | ICD-10-CM | POA: Diagnosis not present

## 2021-03-14 MED ORDER — LEVOTHYROXINE SODIUM 50 MCG PO TABS: 50.0000 ug | ORAL_TABLET | Freq: Every day | ORAL | 3 refills | Status: AC

## 2021-03-14 NOTE — Telephone Encounter (Signed)
Pt aware refill sent to [facility]  

## 2021-03-15 LAB — BMP8+EGFR
BUN/Creatinine Ratio: 16 (ref 12–28)
BUN: 15 mg/dL (ref 8–27)
CO2: 26 mmol/L (ref 20–29)
Calcium: 9.8 mg/dL (ref 8.7–10.3)
Chloride: 102 mmol/L (ref 96–106)
Creatinine, Ser: 0.91 mg/dL (ref 0.57–1.00)
Glucose: 91 mg/dL (ref 70–99)
Potassium: 4.7 mmol/L (ref 3.5–5.2)
Sodium: 141 mmol/L (ref 134–144)
eGFR: 72 mL/min/{1.73_m2} (ref >59)

## 2021-04-03 ENCOUNTER — Other Ambulatory Visit: Payer: Self-pay | Admitting: Family Medicine

## 2021-04-03 DIAGNOSIS — I10 Essential (primary) hypertension: Secondary | ICD-10-CM

## 2021-04-10 ENCOUNTER — Other Ambulatory Visit: Payer: Self-pay | Admitting: Family Medicine

## 2021-04-10 DIAGNOSIS — I10 Essential (primary) hypertension: Secondary | ICD-10-CM

## 2021-04-24 ENCOUNTER — Other Ambulatory Visit: Payer: Self-pay | Admitting: Family Medicine

## 2021-04-25 ENCOUNTER — Encounter: Payer: Self-pay | Admitting: *Deleted

## 2021-05-14 ENCOUNTER — Ambulatory Visit
Admission: RE | Admit: 2021-05-14 | Discharge: 2021-05-14 | Disposition: A | Discharge status: Home / Self Care | Payer: BC Managed Care – PPO | Source: Ambulatory Visit | Attending: Family Medicine | Admitting: Family Medicine

## 2021-05-14 DIAGNOSIS — Z1231 Encounter for screening mammogram for malignant neoplasm of breast: Secondary | ICD-10-CM

## 2021-09-09 ENCOUNTER — Ambulatory Visit: Payer: BC Managed Care – PPO | Admitting: Family Medicine

## 2021-09-10 ENCOUNTER — Encounter: Payer: Self-pay | Admitting: Family Medicine

## 2021-09-10 ENCOUNTER — Ambulatory Visit (INDEPENDENT_AMBULATORY_CARE_PROVIDER_SITE_OTHER): Payer: BC Managed Care – PPO | Admitting: Family Medicine

## 2021-09-10 VITALS — BP 104/70 | HR 63 | Temp 97.8°F | Ht 63.0 in | Wt 139.0 lb

## 2021-09-10 DIAGNOSIS — M25511 Pain in right shoulder: Secondary | ICD-10-CM

## 2021-09-10 DIAGNOSIS — E559 Vitamin D deficiency, unspecified: Secondary | ICD-10-CM

## 2021-09-10 DIAGNOSIS — I10 Essential (primary) hypertension: Secondary | ICD-10-CM | POA: Diagnosis not present

## 2021-09-10 DIAGNOSIS — E785 Hyperlipidemia, unspecified: Secondary | ICD-10-CM | POA: Diagnosis not present

## 2021-09-10 DIAGNOSIS — M25512 Pain in left shoulder: Secondary | ICD-10-CM

## 2021-09-10 DIAGNOSIS — E039 Hypothyroidism, unspecified: Secondary | ICD-10-CM | POA: Diagnosis not present

## 2021-09-10 DIAGNOSIS — Z85828 Personal history of other malignant neoplasm of skin: Secondary | ICD-10-CM

## 2021-09-10 DIAGNOSIS — M549 Dorsalgia, unspecified: Secondary | ICD-10-CM

## 2021-09-10 DIAGNOSIS — N62 Hypertrophy of breast: Secondary | ICD-10-CM

## 2021-09-10 DIAGNOSIS — L989 Disorder of the skin and subcutaneous tissue, unspecified: Secondary | ICD-10-CM

## 2021-09-10 DIAGNOSIS — G8929 Other chronic pain: Secondary | ICD-10-CM

## 2021-09-10 NOTE — Progress Notes (Signed)
Acute Office Visit  Subjective:     Patient ID: Cynthia Bolton, female    DOB: 1960-12-12, 60 y.o.   MRN: [id number]  Chief Complaint  Patient presents with   Medical Management of Chronic Issues    Hypertension This is a chronic problem. The problem is unchanged. The problem is controlled. Pertinent negatives include no blurred vision, chest pain, headaches, malaise/fatigue, orthopnea, palpitations, peripheral edema, PND, shortness of breath or sweats. Risk factors for coronary artery disease include dyslipidemia and post-menopausal state. Past treatments include angiotensin blockers. There are no compliance problems.  There is no history of angina, kidney disease, CAD/MI, CVA, heart failure, left ventricular hypertrophy, PVD or retinopathy. There is no history of chronic renal disease.  Hyperlipidemia This is a chronic problem. Exacerbating diseases include hypothyroidism. She has no history of chronic renal disease, diabetes, obesity or nephrotic syndrome. Pertinent negatives include no chest pain or shortness of breath. Current antihyperlipidemic treatment includes diet change, exercise and herbal therapy. Risk factors for coronary artery disease include hypertension and post-menopausal.   Skin lesion Noticed a spot on her right breast about 2 months ago. It has changed in color over and has gotten larger. No masses, nipple discharge or retraction. Denies breast pain. Mammogram in April was normal. She has a history of skin cancer and has not been evaluated in 5 years. She has been seen by a dermatologist in [place] most recently.   Breast reduction She is interested in a consult for a breast reduction. She is a [employer] cup. She has chronic bilaterally shoulder and upper back pain. Her breasts feel heavy and this contributes to her pain.     09/10/2021   11:43 AM 03/12/2021   12:15 PM 07/04/2020    1:29 PM  Depression screen PHQ 2/9  Decreased Interest 0 0 0  Down, Depressed, Hopeless 0 0 0   PHQ - 2 Score 0 0 0  Altered sleeping 3  0  Tired, decreased energy 1  0  Change in appetite 0  0  Feeling bad or failure about yourself  0  0  Trouble concentrating 0  0  Moving slowly or fidgety/restless 0  0  Suicidal thoughts 0  0  PHQ-9 Score 4  0  Difficult doing work/chores Not difficult at all        07/04/2020    1:29 PM  GAD 7 : Generalized Anxiety Score  Nervous, Anxious, on Edge 1  Control/stop worrying 0  Worry too much - different things 1  Trouble relaxing 0  Restless 0  Easily annoyed or irritable 0  Afraid - awful might happen 0  Total GAD 7 Score 2     Review of Systems  Constitutional:  Negative for malaise/fatigue.  Eyes:  Negative for blurred vision.  Respiratory:  Negative for shortness of breath.   Cardiovascular:  Negative for chest pain, palpitations, orthopnea and PND.  Neurological:  Negative for headaches.        Objective:    Ht 5' 3"  (1.6 m)   Wt 139 lb (63 kg)   BMI 24.62 kg/m    Physical Exam Nursing note reviewed.  Constitutional:      General: She is not in acute distress.    Appearance: She is not ill-appearing, toxic-appearing or diaphoretic.  HENT:     Head: Normocephalic and atraumatic.  Neck:     Thyroid: No thyroid mass, thyromegaly or thyroid tenderness.     Vascular: No carotid bruit or JVD.  Cardiovascular:     Rate and Rhythm: Normal rate and regular rhythm.     Heart sounds: Normal heart sounds. No murmur heard. Pulmonary:     Effort: Pulmonary effort is normal. No respiratory distress.     Breath sounds: Normal breath sounds.  Chest:  Breasts:    Right: No swelling, bleeding, inverted nipple, mass, nipple discharge, skin change or tenderness.     Left: No swelling, bleeding, inverted nipple, mass, nipple discharge, skin change or tenderness.     Comments: Raised irregular flesh colored lesion to right lateral breast. No changes around lesion.  Abdominal:     General: Bowel sounds are normal. There is no  distension.     Palpations: Abdomen is soft.     Tenderness: There is no abdominal tenderness. There is no guarding.  Musculoskeletal:     Cervical back: Neck supple.     Right lower leg: No edema.     Left lower leg: No edema.  Skin:    General: Skin is warm and dry.  Neurological:     General: No focal deficit present.     Mental Status: She is alert and oriented to person, place, and time.  Psychiatric:        Mood and Affect: Mood normal.        Behavior: Behavior normal.     No results found for any visits on 09/10/21.      Assessment & Plan:   Signe was seen today for medical management of chronic issues.  Diagnoses and all orders for this visit:  Primary hypertension Well controlled on current regimen. Labs pending -     CBC with Differential/Platelet -     CMP14+EGFR -     Lipid panel -     TSH  Acquired hypothyroidism On synthroid. Labs pending.  -     TSH  Vitamin D deficiency Not on repletion therapy. Lab pending.  -     VITAMIN D 25 Hydroxy (Vit-D Deficiency, Fractures)  Hyperlipidemia, unspecified hyperlipidemia type Diet and exercise. Labs pending.  -     CBC with Differential/Platelet -     CMP14+EGFR -     Lipid panel  Large breasts Chronic pain of both shoulders Chronic upper back pain Referral placed for consultation regarding breast reduction.  -     Ambulatory referral to Plastic Surgery  Skin lesion Hx of nonmelanoma skin cancer Irregular borders with changes over 2 months. Referral placed to dermatology.  -     Ambulatory referral to Dermatology   Return in about 6 months (around 03/13/2022) for CPE.  The patient indicates understanding of these issues and agrees with the plan.   Gwenlyn Perking, FNP

## 2021-09-10 NOTE — Patient Instructions (Signed)
Cynthia Bolton (dermatology) Address: [address], [place], [place] [address] Phone: [phone]

## 2021-09-11 ENCOUNTER — Telehealth: Payer: Self-pay | Admitting: *Deleted

## 2021-09-11 ENCOUNTER — Other Ambulatory Visit: Payer: Self-pay | Admitting: Family Medicine

## 2021-09-11 DIAGNOSIS — I10 Essential (primary) hypertension: Secondary | ICD-10-CM

## 2021-09-11 DIAGNOSIS — E559 Vitamin D deficiency, unspecified: Secondary | ICD-10-CM

## 2021-09-11 DIAGNOSIS — E875 Hyperkalemia: Secondary | ICD-10-CM

## 2021-09-11 LAB — CMP14+EGFR
ALT: 14 IU/L (ref 0–32)
AST: 12 IU/L (ref 0–40)
Albumin/Globulin Ratio: 1.9 (ref 1.2–2.2)
Albumin: 4.5 g/dL (ref 3.9–4.9)
Alkaline Phosphatase: 84 IU/L (ref 44–121)
BUN/Creatinine Ratio: 19 (ref 12–28)
BUN: 17 mg/dL (ref 8–27)
Bilirubin Total: 0.4 mg/dL (ref 0.0–1.2)
CO2: 26 mmol/L (ref 20–29)
Calcium: 10.2 mg/dL (ref 8.7–10.3)
Chloride: 100 mmol/L (ref 96–106)
Creatinine, Ser: 0.9 mg/dL (ref 0.57–1.00)
Globulin, Total: 2.4 g/dL (ref 1.5–4.5)
Glucose: 103 mg/dL — ABNORMAL HIGH (ref 70–99)
Potassium: 5.9 mmol/L (ref 3.5–5.2)
Sodium: 140 mmol/L (ref 134–144)
Total Protein: 6.9 g/dL (ref 6.0–8.5)
eGFR: 73 mL/min/{1.73_m2} (ref >59)

## 2021-09-11 LAB — CBC WITH DIFFERENTIAL/PLATELET
Basophils Absolute: 0.1 10*3/uL (ref 0.0–0.2)
Basos: 1 %
EOS (ABSOLUTE): 0.1 10*3/uL (ref 0.0–0.4)
Eos: 1 %
Hematocrit: 35.8 % (ref 34.0–46.6)
Hemoglobin: 12 g/dL (ref 11.1–15.9)
Immature Grans (Abs): 0.1 10*3/uL (ref 0.0–0.1)
Immature Granulocytes: 1 %
Lymphocytes Absolute: 3.8 10*3/uL — ABNORMAL HIGH (ref 0.7–3.1)
Lymphs: 41 %
MCH: 30.2 pg (ref 26.6–33.0)
MCHC: 33.5 g/dL (ref 31.5–35.7)
MCV: 90 fL (ref 79–97)
Monocytes Absolute: 0.5 10*3/uL (ref 0.1–0.9)
Monocytes: 5 %
Neutrophils Absolute: 4.6 10*3/uL (ref 1.4–7.0)
Neutrophils: 51 %
Platelets: 345 10*3/uL (ref 150–450)
RBC: 3.97 x10E6/uL (ref 3.77–5.28)
RDW: 12.3 % (ref 11.7–15.4)
WBC: 9.2 10*3/uL (ref 3.4–10.8)

## 2021-09-11 LAB — LIPID PANEL
Chol/HDL Ratio: 4.6 ratio — ABNORMAL HIGH (ref 0.0–4.4)
Cholesterol, Total: 229 mg/dL — ABNORMAL HIGH (ref 100–199)
HDL: 50 mg/dL (ref >39)
LDL Chol Calc (NIH): 123 mg/dL — ABNORMAL HIGH (ref 0–99)
Triglycerides: 316 mg/dL — ABNORMAL HIGH (ref 0–149)
VLDL Cholesterol Cal: 56 mg/dL — ABNORMAL HIGH (ref 5–40)

## 2021-09-11 LAB — VITAMIN D 25 HYDROXY (VIT D DEFICIENCY, FRACTURES): Vit D, 25-Hydroxy: 27.2 ng/mL — ABNORMAL LOW (ref 30.0–100.0)

## 2021-09-11 LAB — TSH: TSH: 1.87 u[IU]/mL (ref 0.450–4.500)

## 2021-09-11 MED ORDER — AMLODIPINE BESYLATE 5 MG PO TABS: 5.0000 mg | ORAL_TABLET | Freq: Every day | ORAL | 1 refills | Status: AC

## 2021-09-11 MED ORDER — SODIUM POLYSTYRENE SULFONATE 15 GM/60ML PO SUSP: 15.0000 g | Freq: Once | ORAL | 0 refills | Status: AC

## 2021-09-11 MED ORDER — VITAMIN D (ERGOCALCIFEROL) 1.25 MG (50000 UNIT) PO CAPS: 50000.0000 [IU] | ORAL_CAPSULE | ORAL | 0 refills | Status: AC

## 2021-09-11 NOTE — Telephone Encounter (Signed)
Pt needs repeat labs tomorrow. Future lab orders placed.

## 2021-09-12 ENCOUNTER — Other Ambulatory Visit: Payer: BC Managed Care – PPO

## 2021-09-12 DIAGNOSIS — E875 Hyperkalemia: Secondary | ICD-10-CM

## 2021-09-13 LAB — POTASSIUM: Potassium: 4.4 mmol/L (ref 3.5–5.2)

## 2022-01-22 HISTORY — PX: REDUCTION MAMMAPLASTY: SUR839

## 2022-03-13 ENCOUNTER — Ambulatory Visit: Payer: BC Managed Care – PPO | Admitting: Family Medicine

## 2022-05-27 ENCOUNTER — Ambulatory Visit: Payer: BC Managed Care – PPO | Admitting: Family Medicine

## 2022-06-08 IMAGING — MG MM DIGITAL SCREENING BILAT W/ TOMO AND CAD
6 of 10 series · 6 of 30 positions shown · non-contrast
Comparison: Previous exam(s).

CLINICAL DATA: Screening.

EXAM:
DIGITAL SCREENING BILATERAL MAMMOGRAM WITH TOMOSYNTHESIS AND CAD
TECHNIQUE: Bilateral screening digital craniocaudal and mediolateral oblique
mammograms were obtained. Bilateral screening digital breast
tomosynthesis was performed. The images were evaluated with
computer-aided detection.

[R MLO synth-2D]
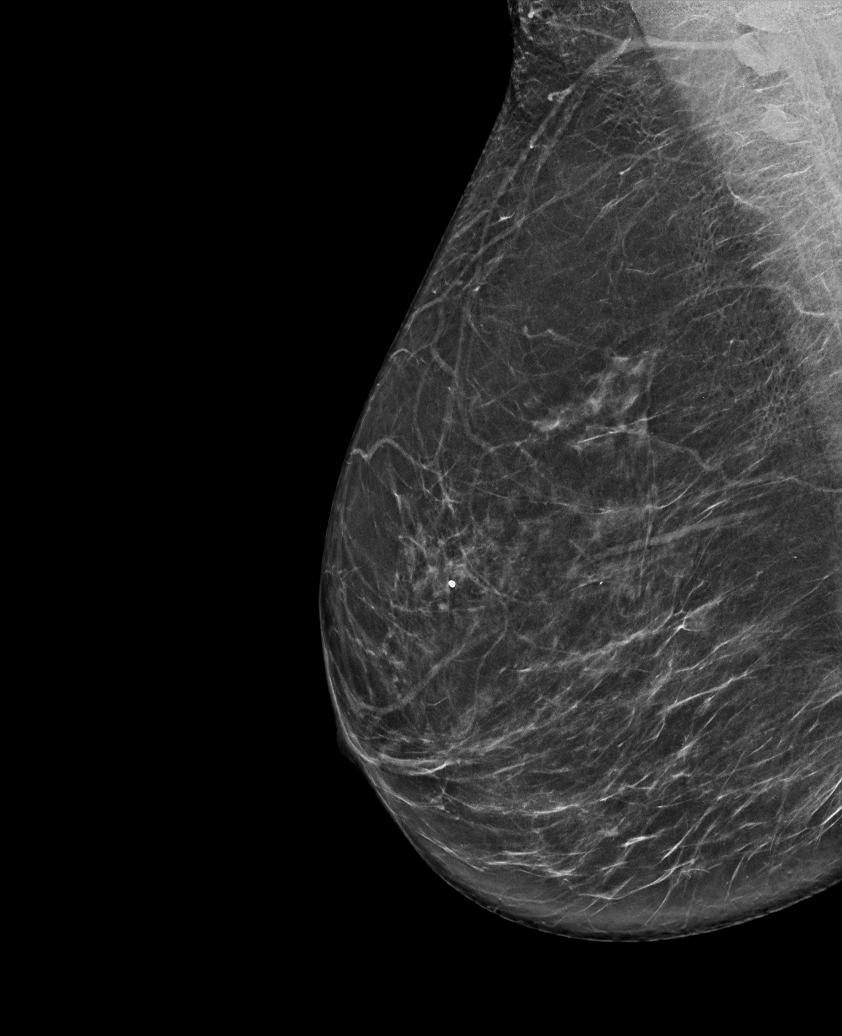

[R CC synth-2D]
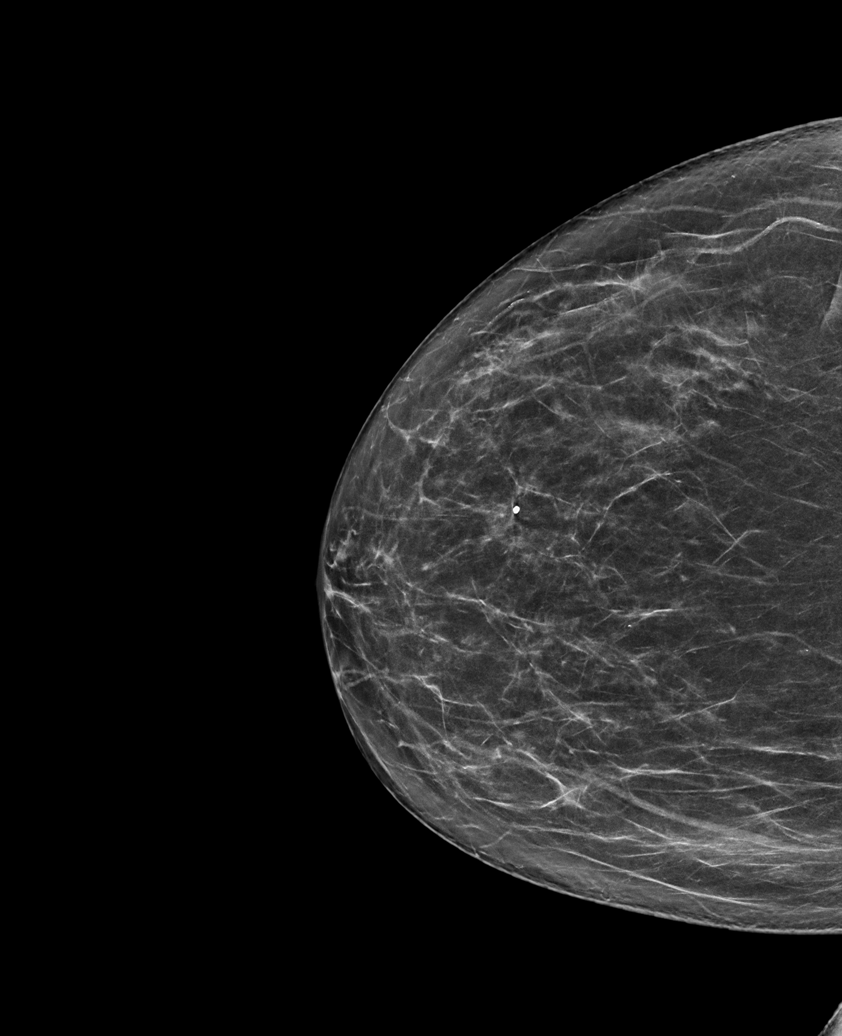

[L MLO synth-2D (1 of 2)]
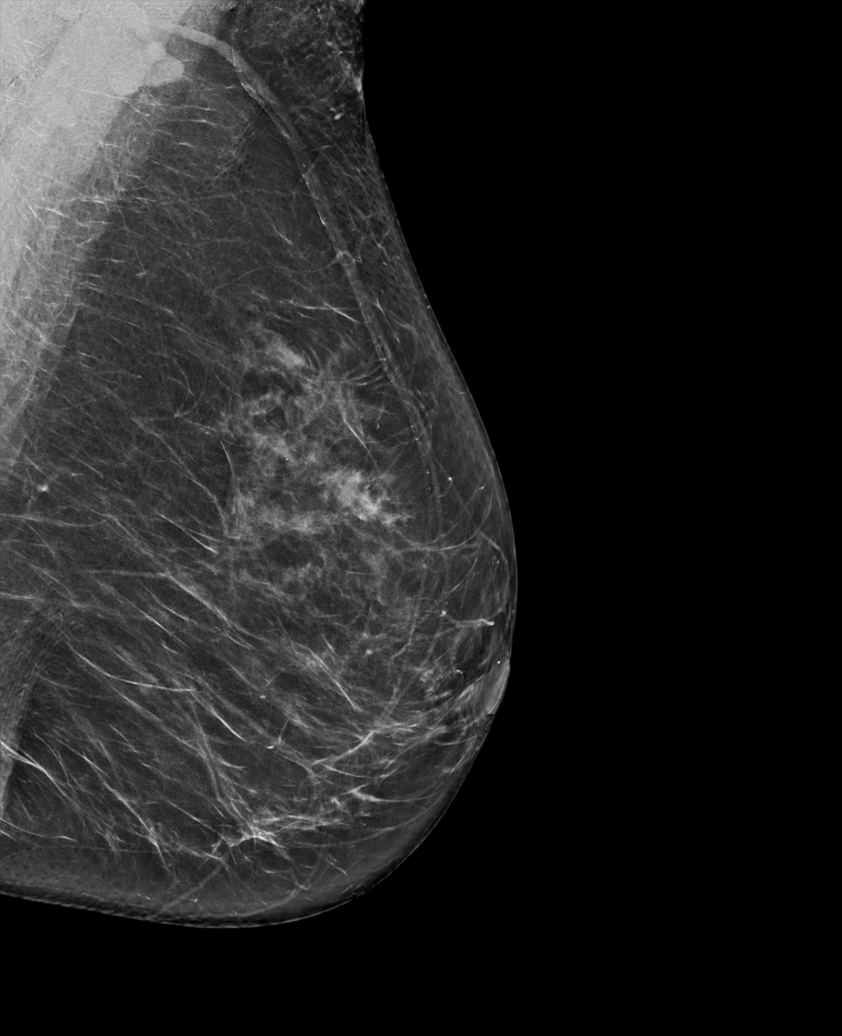

[L MLO synth-2D (2 of 2)]
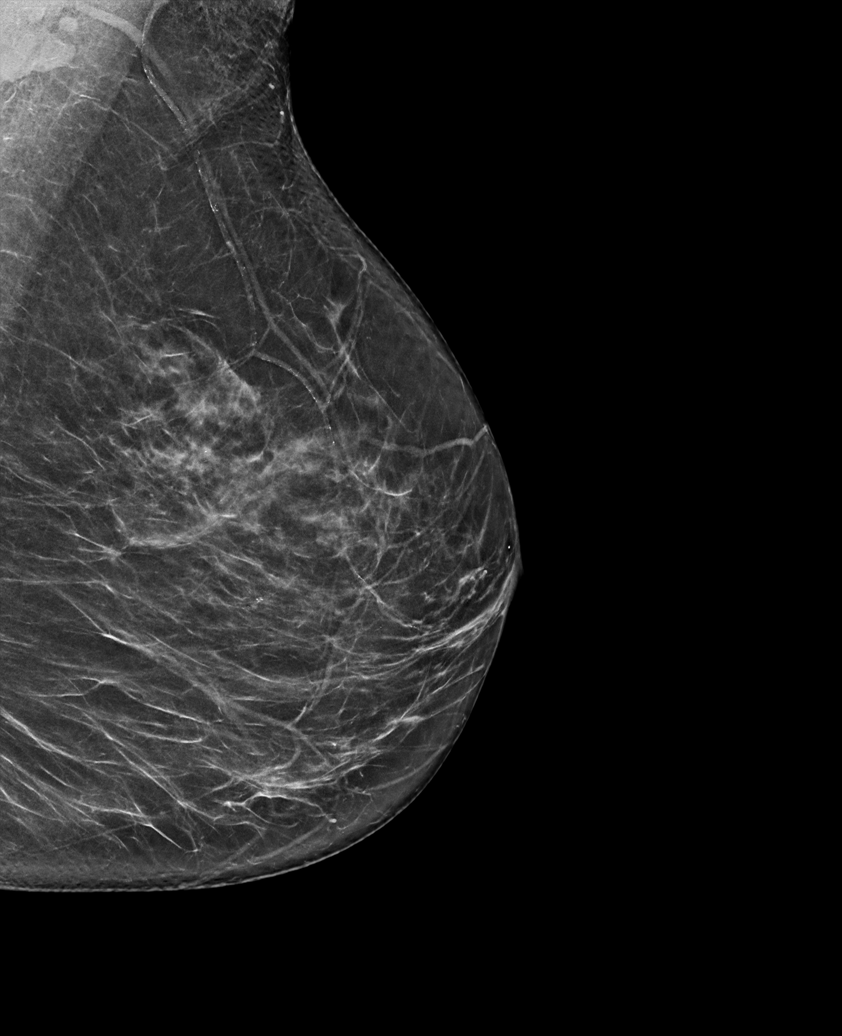

[L CC synth-2D]
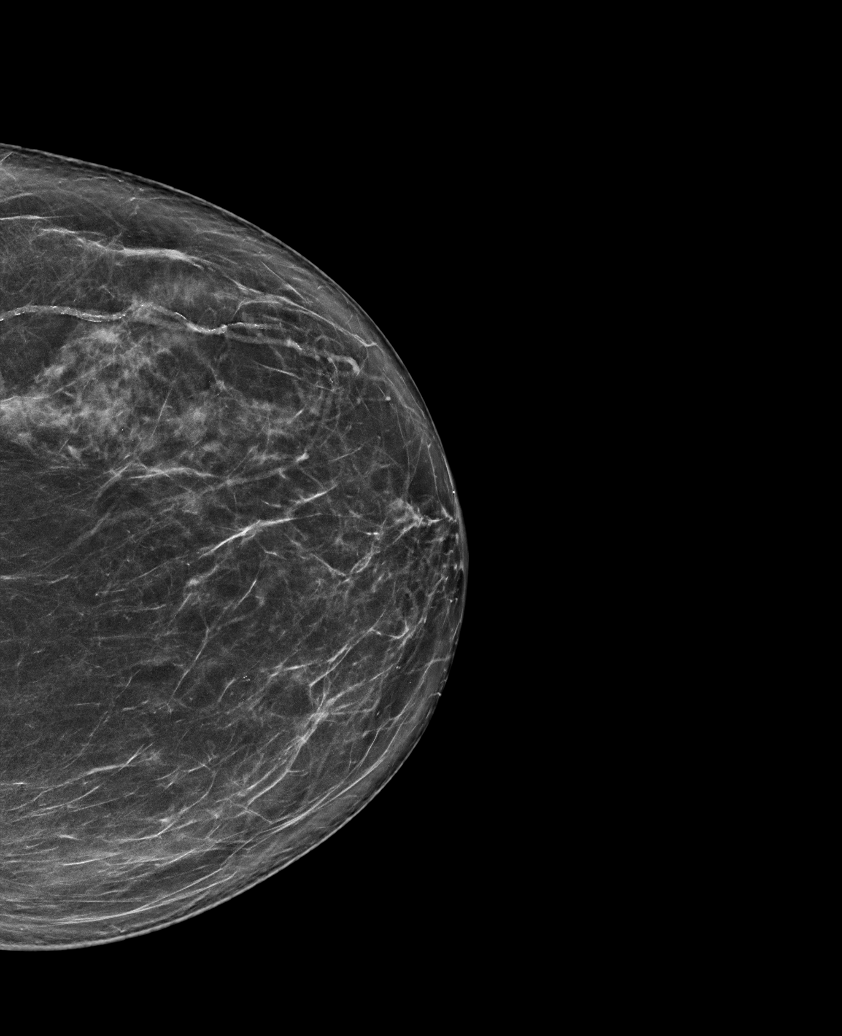

[L CC tomo · tomo slice 41/80.0]
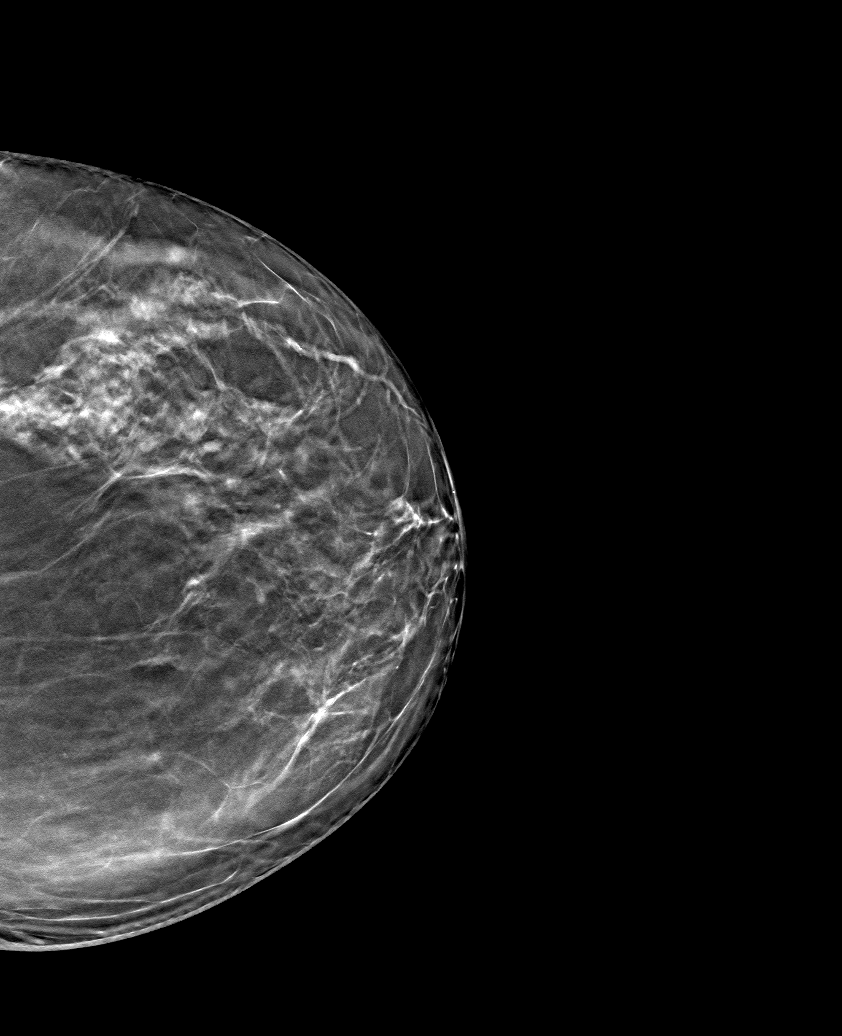

[6 of 30 positions shown; findings below may reference images not displayed]

ACR Breast Density Category b: There are scattered areas of
fibroglandular density.
FINDINGS: There are no findings suspicious for malignancy.
IMPRESSION: No mammographic evidence of malignancy. A result letter of this
screening mammogram will be mailed directly to the patient.

RECOMMENDATION:
Screening mammogram in one year. (Code:51-O-LD2)

BI-RADS CATEGORY  1: Negative.

## 2022-09-10 ENCOUNTER — Encounter: Payer: BC Managed Care – PPO | Admitting: Family Medicine

## 2022-10-07 ENCOUNTER — Other Ambulatory Visit: Payer: Self-pay | Admitting: Family Medicine

## 2022-10-07 DIAGNOSIS — Z1231 Encounter for screening mammogram for malignant neoplasm of breast: Secondary | ICD-10-CM

## 2022-11-16 ENCOUNTER — Ambulatory Visit
Admission: RE | Admit: 2022-11-16 | Discharge: 2022-11-16 | Disposition: A | Discharge status: Home / Self Care | Payer: BC Managed Care – PPO | Source: Ambulatory Visit | Attending: Family Medicine | Admitting: Family Medicine

## 2022-11-16 ENCOUNTER — Other Ambulatory Visit: Payer: Self-pay | Admitting: Nurse Practitioner

## 2022-11-16 ENCOUNTER — Other Ambulatory Visit: Payer: Self-pay | Admitting: Family Medicine

## 2022-11-16 DIAGNOSIS — Z1231 Encounter for screening mammogram for malignant neoplasm of breast: Secondary | ICD-10-CM

## 2022-12-23 ENCOUNTER — Encounter: Payer: BC Managed Care – PPO | Admitting: Family Medicine
